# Patient Record
Sex: Male | Born: 1965 | Race: White | Hispanic: No | Marital: Single | Smoking: Never smoker
Health system: Southern US, Community
[De-identification: ages and names within clinical notes are randomized; demographics above are authoritative.]

## PROBLEM LIST (undated history)

## (undated) DIAGNOSIS — Z87442 Personal history of urinary calculi: Secondary | ICD-10-CM

## (undated) DIAGNOSIS — Z8669 Personal history of other diseases of the nervous system and sense organs: Secondary | ICD-10-CM

## (undated) DIAGNOSIS — T7840XA Allergy, unspecified, initial encounter: Secondary | ICD-10-CM

## (undated) DIAGNOSIS — G4730 Sleep apnea, unspecified: Secondary | ICD-10-CM

## (undated) DIAGNOSIS — D649 Anemia, unspecified: Secondary | ICD-10-CM

## (undated) HISTORY — PX: COLONOSCOPY: SHX174

## (undated) HISTORY — DX: Allergy, unspecified, initial encounter: T78.40XA

## (undated) HISTORY — DX: Anemia, unspecified: D64.9

## (undated) HISTORY — PX: EYE SURGERY: SHX253

## (undated) HISTORY — DX: Personal history of urinary calculi: Z87.442

## (undated) HISTORY — DX: Personal history of other diseases of the nervous system and sense organs: Z86.69

## (undated) HISTORY — DX: Sleep apnea, unspecified: G47.30

---

## 2001-09-08 ENCOUNTER — Ambulatory Visit (HOSPITAL_BASED_OUTPATIENT_CLINIC_OR_DEPARTMENT_OTHER): Admission: RE | Admit: 2001-09-08 | Discharge: 2001-09-08 | Payer: Self-pay | Admitting: Family Medicine

## 2003-02-15 ENCOUNTER — Encounter: Admission: RE | Admit: 2003-02-15 | Discharge: 2003-02-15 | Payer: Self-pay | Admitting: Family Medicine

## 2003-02-15 ENCOUNTER — Encounter: Payer: Self-pay | Admitting: Family Medicine

## 2003-05-11 ENCOUNTER — Emergency Department (HOSPITAL_COMMUNITY): Admission: AD | Admit: 2003-05-11 | Discharge: 2003-05-11 | Payer: Self-pay | Admitting: Family Medicine

## 2006-05-30 ENCOUNTER — Encounter: Admission: RE | Admit: 2006-05-30 | Discharge: 2006-05-30 | Payer: Self-pay | Admitting: Family Medicine

## 2009-12-20 ENCOUNTER — Ambulatory Visit (HOSPITAL_BASED_OUTPATIENT_CLINIC_OR_DEPARTMENT_OTHER): Admission: RE | Admit: 2009-12-20 | Discharge: 2009-12-20 | Payer: Self-pay | Admitting: Family Medicine

## 2009-12-30 ENCOUNTER — Ambulatory Visit: Payer: Self-pay | Admitting: Internal Medicine

## 2013-02-07 ENCOUNTER — Emergency Department (HOSPITAL_COMMUNITY): Payer: 59

## 2013-02-07 ENCOUNTER — Encounter (HOSPITAL_COMMUNITY): Payer: Self-pay | Admitting: Emergency Medicine

## 2013-02-07 ENCOUNTER — Emergency Department (HOSPITAL_COMMUNITY)
Admission: EM | Admit: 2013-02-07 | Discharge: 2013-02-07 | Disposition: A | Discharge status: Home / Self Care | Payer: 59 | Attending: Emergency Medicine | Admitting: Emergency Medicine

## 2013-02-07 DIAGNOSIS — N201 Calculus of ureter: Secondary | ICD-10-CM | POA: Insufficient documentation

## 2013-02-07 LAB — CBC WITH DIFFERENTIAL/PLATELET
Basophils Relative: 1 % (ref 0–1)
Eosinophils Relative: 2 % (ref 0–5)
HCT: 44.1 % (ref 39.0–52.0)
Hemoglobin: 14.7 g/dL (ref 13.0–17.0)
Lymphocytes Relative: 19 % (ref 12–46)
MCH: 28.5 pg (ref 26.0–34.0)
MCHC: 33.3 g/dL (ref 30.0–36.0)
Monocytes Absolute: 0.5 10*3/uL (ref 0.1–1.0)
Monocytes Relative: 8 % (ref 3–12)
Platelets: 198 10*3/uL (ref 150–400)
RDW: 14 % (ref 11.5–15.5)
WBC: 6.4 10*3/uL (ref 4.0–10.5)

## 2013-02-07 LAB — COMPREHENSIVE METABOLIC PANEL
ALT: 24 U/L (ref 0–53)
Albumin: 4.3 g/dL (ref 3.5–5.2)
Alkaline Phosphatase: 65 U/L (ref 39–117)
BUN: 18 mg/dL (ref 6–23)
Glucose, Bld: 116 mg/dL — ABNORMAL HIGH (ref 70–99)
Potassium: 3.9 mEq/L (ref 3.5–5.1)
Total Bilirubin: 0.4 mg/dL (ref 0.3–1.2)
Total Protein: 7.6 g/dL (ref 6.0–8.3)

## 2013-02-07 LAB — URINALYSIS, ROUTINE W REFLEX MICROSCOPIC
Bilirubin Urine: NEGATIVE
Protein, ur: NEGATIVE mg/dL

## 2013-02-07 LAB — CK TOTAL AND CKMB (NOT AT ARMC)
CK, MB: 1.5 ng/mL (ref 0.3–4.0)
Relative Index: INVALID (ref 0.0–2.5)
Total CK: 57 U/L (ref 7–232)

## 2013-02-07 LAB — URINE MICROSCOPIC-ADD ON

## 2013-02-07 MED ORDER — TAMSULOSIN HCL 0.4 MG PO CAPS: 0.4000 mg | ORAL_CAPSULE | Freq: Every day | ORAL | Status: DC

## 2013-02-07 MED ORDER — SODIUM CHLORIDE 0.9 % IV BOLUS (SEPSIS)
1000.0000 mL | Freq: Once | INTRAVENOUS | Status: AC
  Administered 2013-02-07: 1000 mL via INTRAVENOUS

## 2013-02-07 MED ORDER — OXYCODONE-ACETAMINOPHEN 5-325 MG PO TABS: 2.0000 | ORAL_TABLET | ORAL | Status: DC | PRN

## 2013-02-07 NOTE — ED Provider Notes (Signed)
CSN: [id number]     Arrival date & time 02/07/13  0730 History     First MD Initiated Contact with Patient 02/07/13 [phone]     Chief Complaint  Patient presents with  . Flank Pain   (Consider location/radiation/quality/duration/timing/severity/associated sxs/prior Treatment) HPI Comments: Pt states that he has been having left flank pain intermittently for 1 week:pt states that it happened after her was dead lifting:pt states that his urine has been dark in color:pt denies fever,n/v/d:pt states that he thinks he has had decreased urine output in the last week:denies history of kidney stone:nothing makes the pain better or worse:pain sometimes radiates to left abdomen  The history is provided by the patient. No language interpreter was used.    History reviewed. No pertinent past medical history. No past surgical history on file. No family history on file. History  Substance Use Topics  . Smoking status: Never Smoker   . Smokeless tobacco: Not on file  . Alcohol Use: No    Review of Systems  Constitutional: Negative.   Respiratory: Negative.   Cardiovascular: Negative.     Allergies  Review of patient's allergies indicates not on file.  Home Medications  No current outpatient prescriptions on file. BP 137/98  Pulse 78  Temp(Src) 97.9 F (36.6 C) (Oral)  Resp 16  SpO2 99% Physical Exam  Nursing note and vitals reviewed. Constitutional: He is oriented to person, place, and time. He appears well-developed and well-nourished.  HENT:  Head: Normocephalic and atraumatic.  Eyes: Conjunctivae and EOM are normal.  Neck: Normal range of motion. Neck supple.  Cardiovascular: Normal rate and regular rhythm.   Pulmonary/Chest: Effort normal and breath sounds normal.  Abdominal: Soft. Bowel sounds are normal.  Musculoskeletal: Normal range of motion.  Neurological: He is alert and oriented to person, place, and time.  Skin: Skin is warm and dry.  Psychiatric: He has a normal  mood and affect.    ED Course   Procedures (including critical care time)  Labs Reviewed  COMPREHENSIVE METABOLIC PANEL - Abnormal; Notable for the following:    Glucose, Bld 116 (*)    GFR calc non Af Amer 81 (*)    All other components within normal limits  URINALYSIS, ROUTINE W REFLEX MICROSCOPIC - Abnormal; Notable for the following:    APPearance CLOUDY (*)    Specific Gravity, Urine 1.031 (*)    Hgb urine dipstick LARGE (*)    All other components within normal limits  URINE MICROSCOPIC-ADD ON - Abnormal; Notable for the following:    Casts HYALINE CASTS (*)    Crystals CA OXALATE CRYSTALS (*)    All other components within normal limits  CBC WITH DIFFERENTIAL  CK TOTAL AND CKMB   Ct Abdomen Pelvis Wo Contrast  02/07/2013   *RADIOLOGY REPORT*  Clinical Data: Left side abdominal pain for 1 week, tea colored urine, left flank pain, recently lifting weights  CT ABDOMEN AND PELVIS WITHOUT CONTRAST  Technique:  Multidetector CT imaging of the abdomen and pelvis was performed following the standard protocol without intravenous contrast. Sagittal and coronal MPR images reconstructed from axial data set.  Comparison: None  Findings: Lung bases clear. Left hydronephrosis secondary to a proximal left ureteral calculus 4 mm diameter image 52, 9 mm in length. Question cyst at upper pole left kidney, 2.6 x 2.3 cm image 29. No additional urinary tract calcification. Distal ureters and bladder normal appearance. Within limits of a nonenhanced exam no focal abnormalities of the liver, spleen, pancreas,  kidneys, or adrenal glands otherwise identified.  Circumaortic left renal vein. Normal appendix. Rectosigmoid colon underdistended with suboptimal assessment of wall thickness. Stomach and bowel loops otherwise normal appearance. No mass, adenopathy, free fluid inflammatory process. No acute osseous findings.  IMPRESSION: Left hydronephrosis secondary to a 4 mm diameter x 9 mm length proximal left  ureteral calculus. Suspect cyst at upper pole of the left kidney 2.6 cm diameter; this can be confirmed by non emergent follow-up sonographic evaluation.   Original Report Authenticated By: Ulyses Southward, M.D.   1. Ureteral stone     MDM  Pt is feeling fine at this time and is not needing any medication:discussed need for follow  Up:no infection noted in the urine  Teressa Lower, NP 02/07/13 [phone]

## 2013-02-07 NOTE — ED Notes (Addendum)
Pt c/o lt flank pain, dysuria x 1 wk.

## 2013-02-07 NOTE — ED Provider Notes (Signed)
Medical screening examination/treatment/procedure(s) were performed by non-physician practitioner and as supervising physician I was immediately available for consultation/collaboration.   Kenneisha Cochrane, MD 02/07/13 1340 

## 2013-02-07 NOTE — ED Notes (Signed)
Pt report left sided abdominal pain 8/10 x1 week. Upon further assessment pt reports he was lifting weights on 11/11/22 doing dead lifts and then pain started the next day. Reports his urine was tea colored.

## 2014-07-27 ENCOUNTER — Other Ambulatory Visit (HOSPITAL_COMMUNITY): Payer: Self-pay | Admitting: Urology

## 2014-07-27 DIAGNOSIS — N281 Cyst of kidney, acquired: Secondary | ICD-10-CM

## 2014-08-01 ENCOUNTER — Ambulatory Visit (HOSPITAL_COMMUNITY): Payer: 59

## 2014-08-22 ENCOUNTER — Ambulatory Visit (HOSPITAL_COMMUNITY): Admission: RE | Admit: 2014-08-22 | Payer: 59 | Source: Ambulatory Visit

## 2014-08-24 ENCOUNTER — Ambulatory Visit (HOSPITAL_COMMUNITY)
Admission: RE | Admit: 2014-08-24 | Discharge: 2014-08-24 | Disposition: A | Discharge status: Home / Self Care | Payer: 59 | Source: Ambulatory Visit | Attending: Urology | Admitting: Urology

## 2014-08-24 DIAGNOSIS — Q6100 Congenital renal cyst, unspecified: Secondary | ICD-10-CM | POA: Insufficient documentation

## 2014-08-24 DIAGNOSIS — N281 Cyst of kidney, acquired: Secondary | ICD-10-CM

## 2015-08-30 DIAGNOSIS — N281 Cyst of kidney, acquired: Secondary | ICD-10-CM | POA: Diagnosis not present

## 2015-09-05 DIAGNOSIS — N281 Cyst of kidney, acquired: Secondary | ICD-10-CM | POA: Diagnosis not present

## 2015-09-05 DIAGNOSIS — Z0000 Encounter for general adult medical examination without abnormal findings: Secondary | ICD-10-CM | POA: Diagnosis not present

## 2015-09-05 DIAGNOSIS — N200 Calculus of kidney: Secondary | ICD-10-CM | POA: Diagnosis not present

## 2015-11-07 DIAGNOSIS — G4733 Obstructive sleep apnea (adult) (pediatric): Secondary | ICD-10-CM | POA: Diagnosis not present

## 2016-01-27 IMAGING — US US RENAL
1 series · 14 of 25 positions shown · non-contrast
Comparison: CT 06/29/2013 and 02/07/2013 as well as ultrasound
05/18/2013

CLINICAL DATA: Left renal cyst evaluation.

EXAM:
RENAL/URINARY TRACT ULTRASOUND COMPLETE

[Series 1: us renal · 0.25mm/px · 14 of 63 slices shown]
[im 1/63]
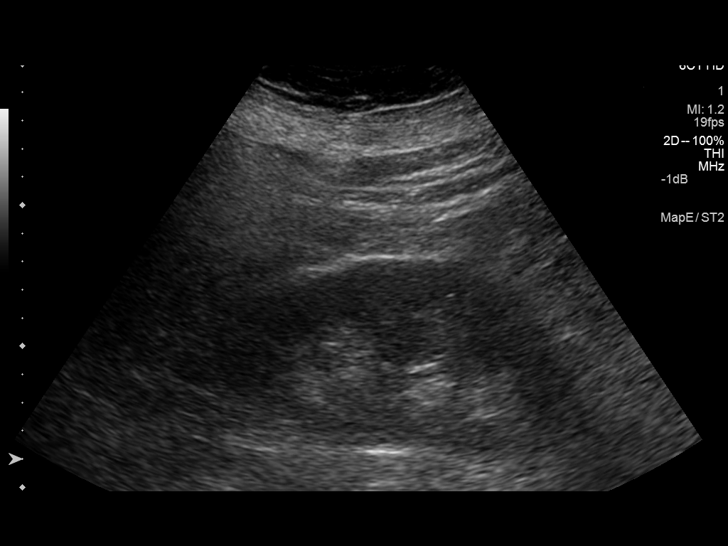
[im 6/63]
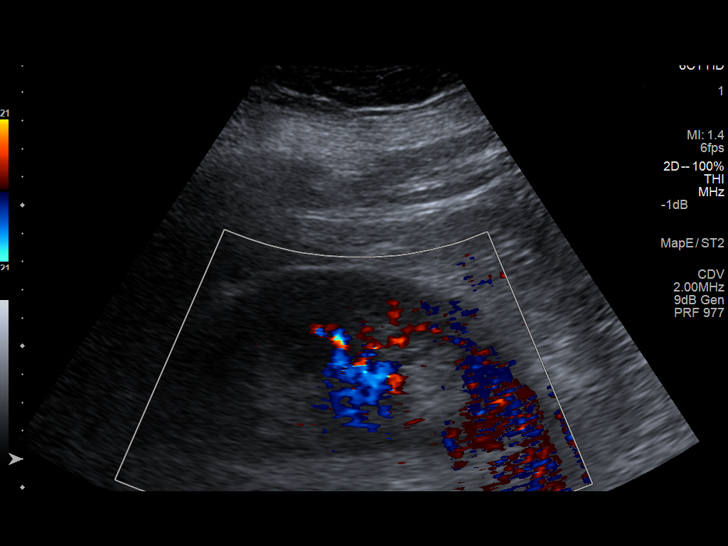
[im 11/63]
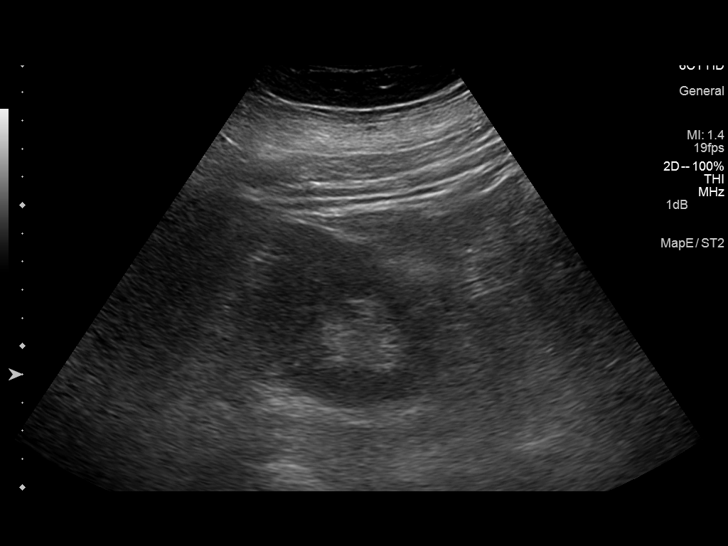
[im 16/63]
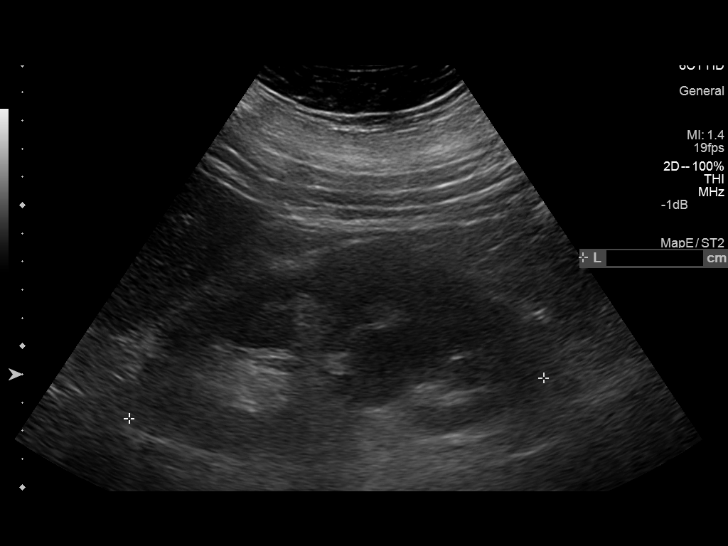
[im 21/63]
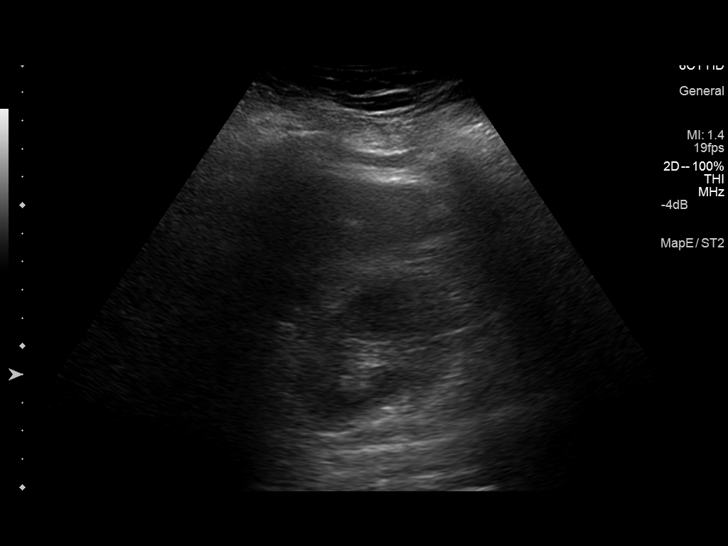
[im 24/63]
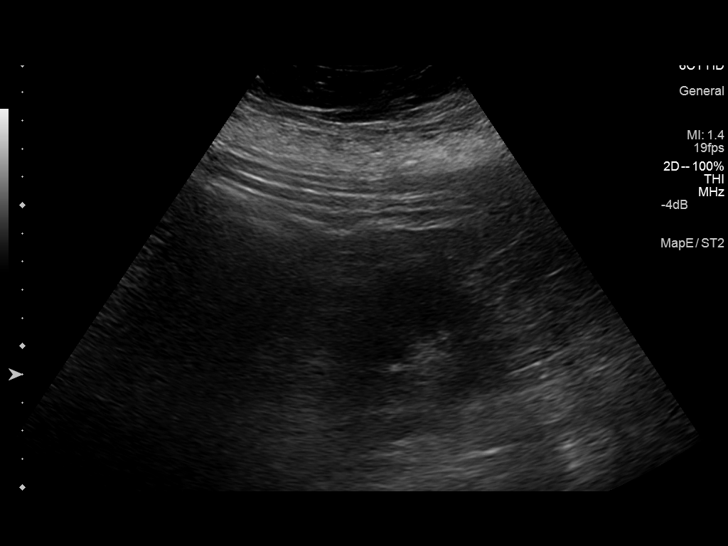
[im 29/63]
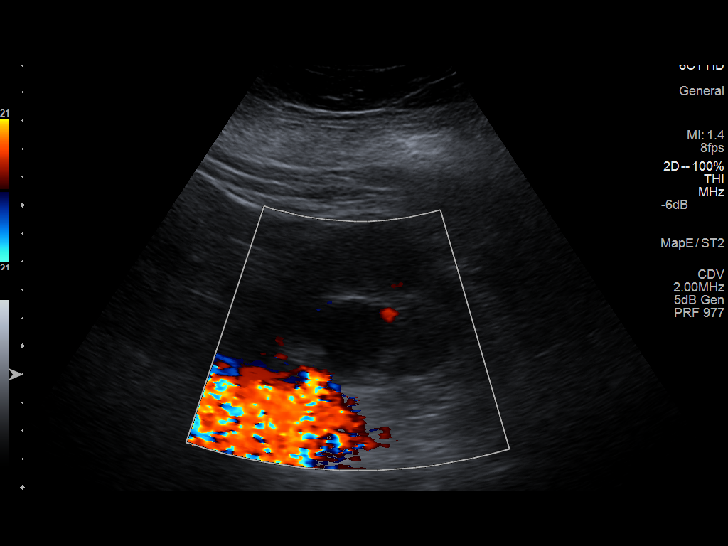
[im 34/63]
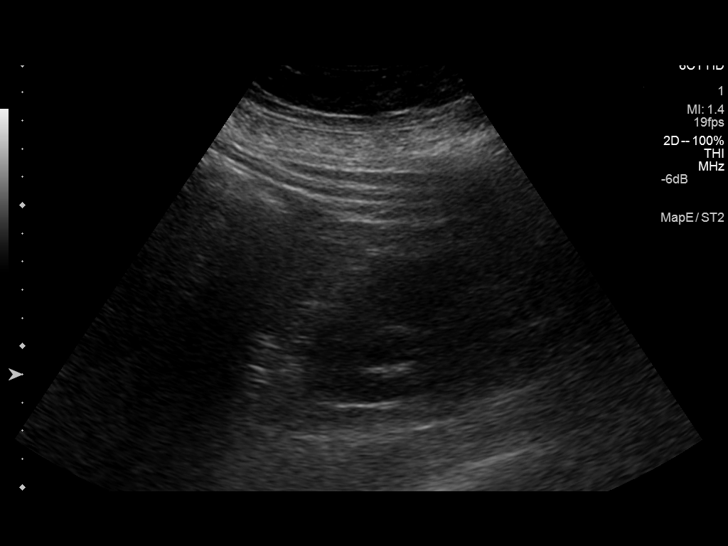
[im 39/63]
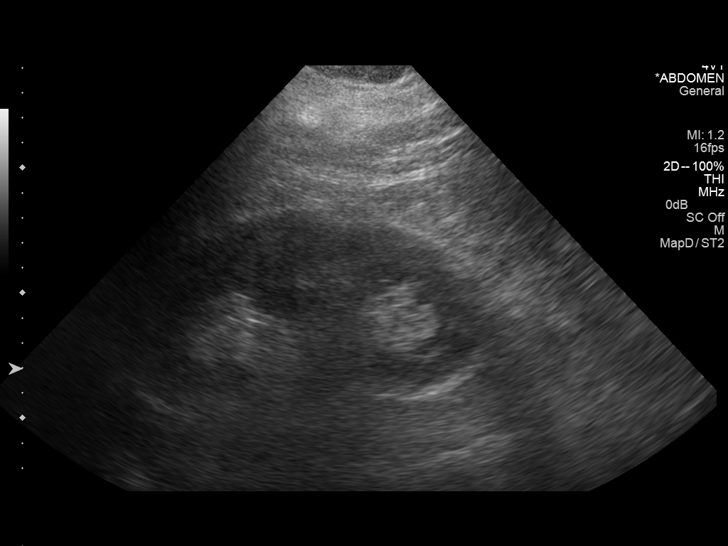
[im 42/63]
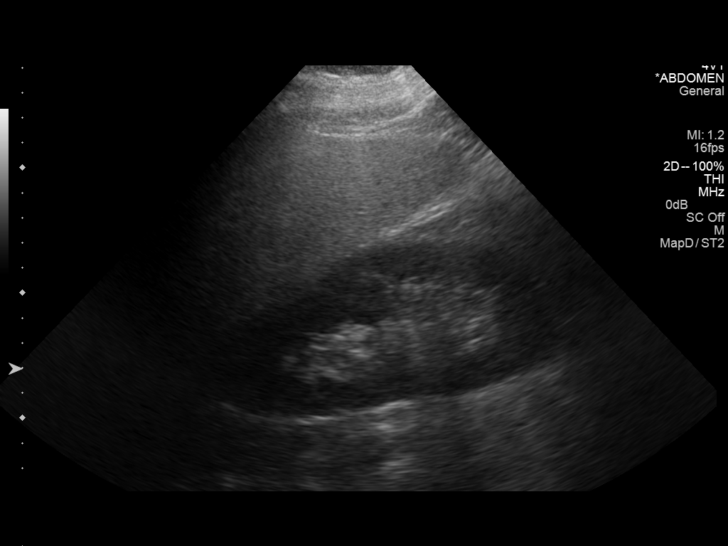
[im 47/63]
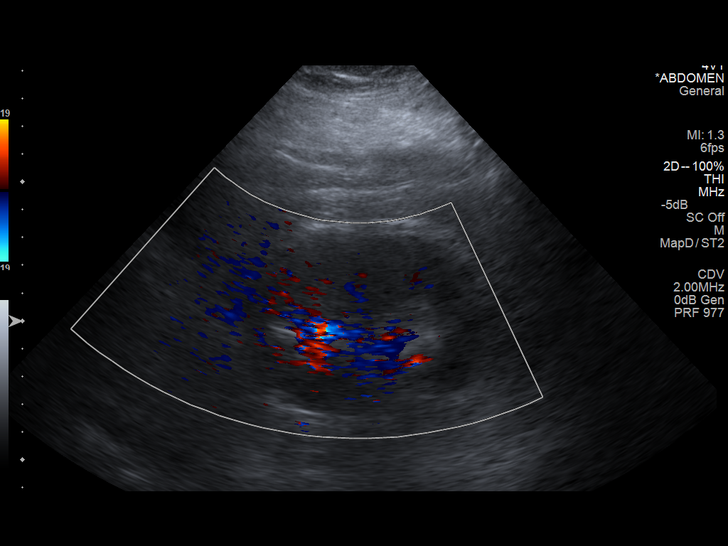
[im 52/63]
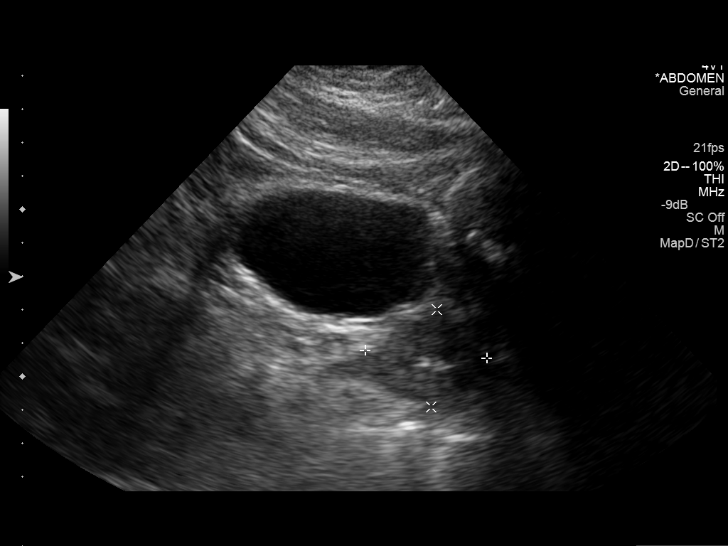
[im 57/63]
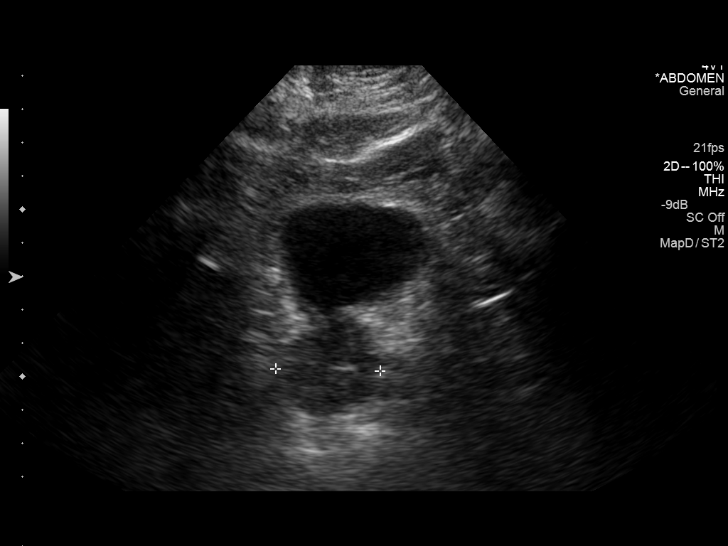
[im 63/63]
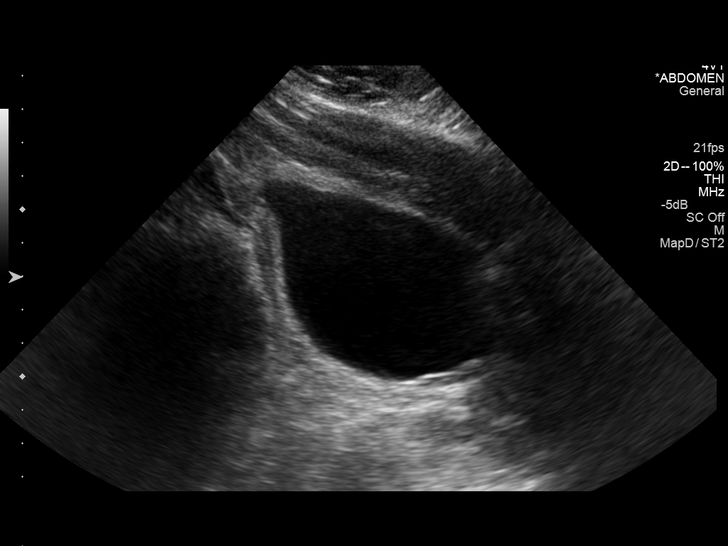

[14 of 25 positions shown; findings below may reference images not displayed]

FINDINGS: Right Kidney:

Length: 14.4 cm. Echogenicity within normal limits. No mass or
hydronephrosis visualized.

Left Kidney:

Length: 14.8 cm. Echogenicity within normal limits. No solid mass or
hydronephrosis visualized. 1.8 cm cyst over the lower pole
unchanged. 3.1 cm cyst over the mid to lower pole without
significant change. The upper pole cyst is stable by CT, although
not well seen sonographically.

Bladder:

Appears normal for degree of bladder distention.
IMPRESSION: Stable left renal cysts.

## 2016-08-01 DIAGNOSIS — G4733 Obstructive sleep apnea (adult) (pediatric): Secondary | ICD-10-CM | POA: Diagnosis not present

## 2016-09-10 DIAGNOSIS — H532 Diplopia: Secondary | ICD-10-CM | POA: Diagnosis not present

## 2016-09-10 DIAGNOSIS — H2513 Age-related nuclear cataract, bilateral: Secondary | ICD-10-CM | POA: Diagnosis not present

## 2016-09-10 DIAGNOSIS — Z880 Allergy status to penicillin: Secondary | ICD-10-CM | POA: Diagnosis not present

## 2016-09-10 DIAGNOSIS — H4912 Fourth [trochlear] nerve palsy, left eye: Secondary | ICD-10-CM | POA: Diagnosis not present

## 2016-09-10 DIAGNOSIS — Z9889 Other specified postprocedural states: Secondary | ICD-10-CM | POA: Diagnosis not present

## 2016-11-22 DIAGNOSIS — M546 Pain in thoracic spine: Secondary | ICD-10-CM | POA: Diagnosis not present

## 2017-08-29 DIAGNOSIS — G4736 Sleep related hypoventilation in conditions classified elsewhere: Secondary | ICD-10-CM | POA: Diagnosis not present

## 2017-08-29 DIAGNOSIS — G4733 Obstructive sleep apnea (adult) (pediatric): Secondary | ICD-10-CM | POA: Diagnosis not present

## 2017-12-10 DIAGNOSIS — G4730 Sleep apnea, unspecified: Secondary | ICD-10-CM | POA: Diagnosis not present

## 2017-12-10 DIAGNOSIS — M25531 Pain in right wrist: Secondary | ICD-10-CM | POA: Diagnosis not present

## 2017-12-10 DIAGNOSIS — M546 Pain in thoracic spine: Secondary | ICD-10-CM | POA: Diagnosis not present

## 2017-12-10 DIAGNOSIS — Z125 Encounter for screening for malignant neoplasm of prostate: Secondary | ICD-10-CM | POA: Diagnosis not present

## 2017-12-10 DIAGNOSIS — I10 Essential (primary) hypertension: Secondary | ICD-10-CM | POA: Diagnosis not present

## 2017-12-10 DIAGNOSIS — Z1211 Encounter for screening for malignant neoplasm of colon: Secondary | ICD-10-CM | POA: Diagnosis not present

## 2017-12-10 DIAGNOSIS — Z0000 Encounter for general adult medical examination without abnormal findings: Secondary | ICD-10-CM | POA: Diagnosis not present

## 2017-12-10 DIAGNOSIS — Z13220 Encounter for screening for lipoid disorders: Secondary | ICD-10-CM | POA: Diagnosis not present

## 2017-12-12 ENCOUNTER — Encounter: Payer: Self-pay | Admitting: Gastroenterology

## 2017-12-19 ENCOUNTER — Encounter: Payer: Self-pay | Admitting: Gastroenterology

## 2017-12-24 DIAGNOSIS — G4733 Obstructive sleep apnea (adult) (pediatric): Secondary | ICD-10-CM | POA: Diagnosis not present

## 2017-12-24 DIAGNOSIS — H532 Diplopia: Secondary | ICD-10-CM | POA: Diagnosis not present

## 2017-12-24 DIAGNOSIS — Z9989 Dependence on other enabling machines and devices: Secondary | ICD-10-CM | POA: Insufficient documentation

## 2017-12-24 DIAGNOSIS — J343 Hypertrophy of nasal turbinates: Secondary | ICD-10-CM | POA: Diagnosis not present

## 2017-12-26 DIAGNOSIS — M545 Low back pain: Secondary | ICD-10-CM | POA: Diagnosis not present

## 2017-12-26 DIAGNOSIS — N281 Cyst of kidney, acquired: Secondary | ICD-10-CM | POA: Diagnosis not present

## 2017-12-26 DIAGNOSIS — N200 Calculus of kidney: Secondary | ICD-10-CM | POA: Diagnosis not present

## 2018-01-07 ENCOUNTER — Encounter: Payer: 59 | Admitting: Gastroenterology

## 2018-01-27 DIAGNOSIS — N281 Cyst of kidney, acquired: Secondary | ICD-10-CM | POA: Diagnosis not present

## 2018-01-27 DIAGNOSIS — M545 Low back pain: Secondary | ICD-10-CM | POA: Diagnosis not present

## 2018-02-03 ENCOUNTER — Encounter: Payer: Self-pay | Admitting: Gastroenterology

## 2018-02-03 ENCOUNTER — Ambulatory Visit (AMBULATORY_SURGERY_CENTER): Payer: Self-pay | Admitting: *Deleted

## 2018-02-03 VITALS — Ht 75.0 in | Wt 346.2 lb

## 2018-02-03 DIAGNOSIS — Z1211 Encounter for screening for malignant neoplasm of colon: Secondary | ICD-10-CM

## 2018-02-03 MED ORDER — NA SULFATE-K SULFATE-MG SULF 17.5-3.13-1.6 GM/177ML PO SOLN: 1.0000 | Freq: Once | ORAL | 0 refills | Status: AC

## 2018-02-03 NOTE — Progress Notes (Signed)
Denies allergies to eggs or soy products. Denies complications with sedation or anesthesia. Denies O2 use. Denies use of diet or weight loss medications.  Emmi instructions given for colonoscopy.  

## 2018-02-17 ENCOUNTER — Encounter: Payer: 59 | Admitting: Gastroenterology

## 2018-02-17 ENCOUNTER — Telehealth: Payer: Self-pay | Admitting: Gastroenterology

## 2018-02-19 NOTE — Telephone Encounter (Signed)
Thanks for letting me know. Pl reschedule when he is ready

## 2018-02-25 ENCOUNTER — Other Ambulatory Visit: Payer: Self-pay | Admitting: Family Medicine

## 2018-02-25 ENCOUNTER — Ambulatory Visit
Admission: RE | Admit: 2018-02-25 | Discharge: 2018-02-25 | Disposition: A | Discharge status: Home / Self Care | Payer: 59 | Source: Ambulatory Visit | Attending: Family Medicine | Admitting: Family Medicine

## 2018-02-25 DIAGNOSIS — R0781 Pleurodynia: Secondary | ICD-10-CM

## 2018-02-25 DIAGNOSIS — R000 Tachycardia, unspecified: Secondary | ICD-10-CM | POA: Diagnosis not present

## 2018-02-25 DIAGNOSIS — R0789 Other chest pain: Secondary | ICD-10-CM | POA: Diagnosis not present

## 2018-02-25 DIAGNOSIS — R232 Flushing: Secondary | ICD-10-CM | POA: Diagnosis not present

## 2018-02-25 DIAGNOSIS — M549 Dorsalgia, unspecified: Secondary | ICD-10-CM | POA: Diagnosis not present

## 2018-02-25 DIAGNOSIS — I10 Essential (primary) hypertension: Secondary | ICD-10-CM | POA: Diagnosis not present

## 2018-02-25 DIAGNOSIS — W57XXXA Bitten or stung by nonvenomous insect and other nonvenomous arthropods, initial encounter: Secondary | ICD-10-CM | POA: Diagnosis not present

## 2018-04-16 DIAGNOSIS — G4733 Obstructive sleep apnea (adult) (pediatric): Secondary | ICD-10-CM | POA: Diagnosis not present

## 2018-06-05 ENCOUNTER — Other Ambulatory Visit: Payer: Self-pay | Admitting: Family Medicine

## 2018-06-05 DIAGNOSIS — S81801S Unspecified open wound, right lower leg, sequela: Secondary | ICD-10-CM

## 2018-06-05 DIAGNOSIS — R609 Edema, unspecified: Secondary | ICD-10-CM | POA: Diagnosis not present

## 2018-06-11 ENCOUNTER — Other Ambulatory Visit: Payer: 59

## 2018-06-16 ENCOUNTER — Other Ambulatory Visit: Payer: 59

## 2018-06-22 ENCOUNTER — Other Ambulatory Visit: Payer: 59

## 2018-06-25 ENCOUNTER — Ambulatory Visit
Admission: RE | Admit: 2018-06-25 | Discharge: 2018-06-25 | Disposition: A | Discharge status: Home / Self Care | Payer: 59 | Source: Ambulatory Visit | Attending: Family Medicine | Admitting: Family Medicine

## 2018-06-25 DIAGNOSIS — S81801A Unspecified open wound, right lower leg, initial encounter: Secondary | ICD-10-CM | POA: Diagnosis not present

## 2018-06-25 DIAGNOSIS — S81801S Unspecified open wound, right lower leg, sequela: Secondary | ICD-10-CM

## 2018-08-12 DIAGNOSIS — H5213 Myopia, bilateral: Secondary | ICD-10-CM | POA: Diagnosis not present

## 2018-08-12 DIAGNOSIS — H524 Presbyopia: Secondary | ICD-10-CM | POA: Diagnosis not present

## 2018-08-14 DIAGNOSIS — H4913 Fourth [trochlear] nerve palsy, bilateral: Secondary | ICD-10-CM | POA: Diagnosis not present

## 2018-08-21 ENCOUNTER — Other Ambulatory Visit: Payer: Self-pay | Admitting: Ophthalmology

## 2018-08-21 DIAGNOSIS — H4913 Fourth [trochlear] nerve palsy, bilateral: Secondary | ICD-10-CM

## 2018-09-07 ENCOUNTER — Ambulatory Visit
Admission: RE | Admit: 2018-09-07 | Discharge: 2018-09-07 | Disposition: A | Discharge status: Home / Self Care | Payer: 59 | Source: Ambulatory Visit | Attending: Ophthalmology | Admitting: Ophthalmology

## 2018-09-07 ENCOUNTER — Other Ambulatory Visit: Payer: Self-pay

## 2018-09-07 DIAGNOSIS — H4913 Fourth [trochlear] nerve palsy, bilateral: Secondary | ICD-10-CM | POA: Diagnosis not present

## 2018-09-07 MED ORDER — GADOBENATE DIMEGLUMINE 529 MG/ML IV SOLN
20.0000 mL | Freq: Once | INTRAVENOUS | Status: AC | PRN
  Administered 2018-09-07: 20 mL via INTRAVENOUS

## 2018-09-16 DIAGNOSIS — R030 Elevated blood-pressure reading, without diagnosis of hypertension: Secondary | ICD-10-CM | POA: Diagnosis not present

## 2018-09-16 DIAGNOSIS — Q759 Congenital malformation of skull and face bones, unspecified: Secondary | ICD-10-CM | POA: Diagnosis not present

## 2018-09-16 DIAGNOSIS — Z6841 Body Mass Index (BMI) 40.0 and over, adult: Secondary | ICD-10-CM | POA: Diagnosis not present

## 2018-11-30 DIAGNOSIS — G4730 Sleep apnea, unspecified: Secondary | ICD-10-CM | POA: Diagnosis not present

## 2018-11-30 DIAGNOSIS — I10 Essential (primary) hypertension: Secondary | ICD-10-CM | POA: Diagnosis not present

## 2018-11-30 DIAGNOSIS — Z0000 Encounter for general adult medical examination without abnormal findings: Secondary | ICD-10-CM | POA: Diagnosis not present

## 2018-11-30 DIAGNOSIS — Z6841 Body Mass Index (BMI) 40.0 and over, adult: Secondary | ICD-10-CM | POA: Diagnosis not present

## 2018-11-30 DIAGNOSIS — Z1211 Encounter for screening for malignant neoplasm of colon: Secondary | ICD-10-CM | POA: Diagnosis not present

## 2018-12-09 DIAGNOSIS — M545 Low back pain: Secondary | ICD-10-CM | POA: Diagnosis not present

## 2018-12-09 DIAGNOSIS — N200 Calculus of kidney: Secondary | ICD-10-CM | POA: Diagnosis not present

## 2018-12-21 DIAGNOSIS — M793 Panniculitis, unspecified: Secondary | ICD-10-CM | POA: Diagnosis not present

## 2018-12-21 DIAGNOSIS — L821 Other seborrheic keratosis: Secondary | ICD-10-CM | POA: Diagnosis not present

## 2019-01-18 ENCOUNTER — Other Ambulatory Visit: Payer: Self-pay

## 2019-01-18 ENCOUNTER — Ambulatory Visit: Payer: 59 | Admitting: *Deleted

## 2019-01-18 VITALS — Ht 75.0 in | Wt 344.0 lb

## 2019-01-18 DIAGNOSIS — Z1211 Encounter for screening for malignant neoplasm of colon: Secondary | ICD-10-CM

## 2019-01-18 MED ORDER — NA SULFATE-K SULFATE-MG SULF 17.5-3.13-1.6 GM/177ML PO SOLN: 1.0000 | Freq: Once | ORAL | 0 refills | Status: AC

## 2019-01-18 NOTE — Progress Notes (Signed)

## 2019-01-26 ENCOUNTER — Other Ambulatory Visit: Payer: Self-pay

## 2019-01-26 DIAGNOSIS — I83899 Varicose veins of unspecified lower extremities with other complications: Secondary | ICD-10-CM

## 2019-01-27 ENCOUNTER — Encounter (HOSPITAL_COMMUNITY): Payer: 59

## 2019-01-27 ENCOUNTER — Encounter: Payer: 59 | Admitting: Vascular Surgery

## 2019-01-29 ENCOUNTER — Telehealth: Payer: Self-pay | Admitting: Gastroenterology

## 2019-01-29 NOTE — Telephone Encounter (Signed)
Left message to call back to ask Covid-19 screening questions.  Covid-19 Screening Questions: Do you now or have you had a fever in the last 14 days?  Do you have any respiratory symptoms of shortness of breath or cough now or in the last 14 days?  Do you have any family members or close contacts with diagnosed or suspected Covid-19 in the past 14 days?  Have you been tested for Covid-19 and found to be positive?   Pls make pt aware of that care partner may wait in the car or come up to the lobby during the procedure but will need to provide their own mask.

## 2019-01-29 NOTE — Telephone Encounter (Signed)
Pt responded "no" to all screening questions °

## 2019-02-01 ENCOUNTER — Other Ambulatory Visit: Payer: Self-pay

## 2019-02-01 ENCOUNTER — Ambulatory Visit (AMBULATORY_SURGERY_CENTER): Payer: 59 | Admitting: Gastroenterology

## 2019-02-01 ENCOUNTER — Encounter: Payer: Self-pay | Admitting: Gastroenterology

## 2019-02-01 VITALS — BP 128/86 | HR 84 | Temp 97.5°F | Resp 17 | Ht 75.0 in | Wt 344.0 lb

## 2019-02-01 DIAGNOSIS — D124 Benign neoplasm of descending colon: Secondary | ICD-10-CM

## 2019-02-01 DIAGNOSIS — D120 Benign neoplasm of cecum: Secondary | ICD-10-CM

## 2019-02-01 DIAGNOSIS — Z1211 Encounter for screening for malignant neoplasm of colon: Secondary | ICD-10-CM

## 2019-02-01 DIAGNOSIS — D123 Benign neoplasm of transverse colon: Secondary | ICD-10-CM

## 2019-02-01 MED ORDER — SODIUM CHLORIDE 0.9 % IV SOLN: 500.0000 mL | Freq: Once | INTRAVENOUS | Status: DC

## 2019-02-01 NOTE — Progress Notes (Signed)
Called to room to assist during endoscopic procedure.  Patient ID and intended procedure confirmed with present staff. Received instructions for my participation in the procedure from the performing physician.  

## 2019-02-01 NOTE — Progress Notes (Signed)
Pt's states no medical or surgical changes since previsit or office visit.  Temp per June VS per Izora Gala

## 2019-02-01 NOTE — Op Note (Signed)
[place] Patient Name: Travis Fleming Procedure Date: 02/01/2019 9:24 AM MRN: [id number] Endoscopist: Jackquline Denmark , MD Age: 53 Referring MD:  Date of Birth: [birth year] Gender: Male Account #: [id number] Procedure:                Colonoscopy Indications:              Screening for colorectal malignant neoplasm Medicines:                Monitored Anesthesia Care Procedure:                Pre-Anesthesia Assessment:                           - Prior to the procedure, a History and Physical                            was performed, and patient medications and                            allergies were reviewed. The patient's tolerance of                            previous anesthesia was also reviewed. The risks                            and benefits of the procedure and the sedation                            options and risks were discussed with the patient.                            All questions were answered, and informed consent                            was obtained. Prior Anticoagulants: The patient has                            taken no previous anticoagulant or antiplatelet                            agents. ASA Grade Assessment: II - A patient with                            mild systemic disease. After reviewing the risks                            and benefits, the patient was deemed in                            satisfactory condition to undergo the procedure.                           After obtaining informed consent, the colonoscope  was passed under direct vision. Throughout the                            procedure, the patient's blood pressure, pulse, and                            oxygen saturations were monitored continuously. The                            Colonoscope was introduced through the anus and                            advanced to the the cecum, identified by                            appendiceal orifice and  ileocecal valve. The                            colonoscopy was performed without difficulty. The                            patient tolerated the procedure well. The quality                            of the bowel preparation was good. The ileocecal                            valve, appendiceal orifice, and rectum were                            photographed. Scope In: 9:32:32 AM Scope Out: 9:46:30 AM Scope Withdrawal Time: 0 hours 9 minutes 56 seconds  Total Procedure Duration: 0 hours 13 minutes 58 seconds  Findings:                 Two sessile polyps were found in the mid descending                            colon and distal transverse colon. The polyps were                            6 to 8 mm in size. These polyps were removed with a                            cold snare. Resection and retrieval were complete.                            Estimated blood loss: none.                           Non-bleeding external and internal hemorrhoids were                            found during retroflexion and during perianal exam.  The hemorrhoids were moderate.                           The exam was otherwise without abnormality. Complications:            No immediate complications. Estimated Blood Loss:     Estimated blood loss: none. Impression:               - Two 6 to 8 mm polyps in the mid descending colon                            and in the distal transverse colon, removed with a                            cold snare. Resected and retrieved.                           - Non-bleeding external and internal hemorrhoids.                           - The examination was otherwise normal. Recommendation:           - Patient has a contact number available for                            emergencies. The signs and symptoms of potential                            delayed complications were discussed with the                            patient. Return to normal activities  tomorrow.                            Written discharge instructions were provided to the                            patient.                           - Resume previous diet.                           - Continue present medications.                           - Await pathology results.                           - Repeat colonoscopy for surveillance based on                            pathology results.                           - Return to GI clinic PRN. Jackquline Denmark, MD 02/01/2019 9:51:16 AM This report has been signed electronically.

## 2019-02-01 NOTE — Patient Instructions (Signed)
Impression/Recommendations:  Polyp handout given to patient. Hemorrhoid handout given to patient.  Resume previous diet. Continue present medications. Await pathology results.  Repeat colonoscopy for surveillance.  Date to be determined after pathology results reviewed.  Return to GI clinic as needed.  YOU HAD AN ENDOSCOPIC PROCEDURE TODAY AT [facility]:   Refer to the procedure report that was given to you for any specific questions about what was found during the examination.  If the procedure report does not answer your questions, please call your gastroenterologist to clarify.  If you requested that your care partner not be given the details of your procedure findings, then the procedure report has been included in a sealed envelope for you to review at your convenience later.  YOU SHOULD EXPECT: Some feelings of bloating in the abdomen. Passage of more gas than usual.  Walking can help get rid of the air that was put into your GI tract during the procedure and reduce the bloating. If you had a lower endoscopy (such as a colonoscopy or flexible sigmoidoscopy) you may notice spotting of blood in your stool or on the toilet paper. If you underwent a bowel prep for your procedure, you may not have a normal bowel movement for a few days.  Please Note:  You might notice some irritation and congestion in your nose or some drainage.  This is from the oxygen used during your procedure.  There is no need for concern and it should clear up in a day or so.  SYMPTOMS TO REPORT IMMEDIATELY:   Following lower endoscopy (colonoscopy or flexible sigmoidoscopy):  Excessive amounts of blood in the stool  Significant tenderness or worsening of abdominal pains  Swelling of the abdomen that is new, acute  Fever of 100F or higher For urgent or emergent issues, a gastroenterologist can be reached at any hour by calling [phone].   DIET:  We do recommend a small meal at first, but  then you may proceed to your regular diet.  Drink plenty of fluids but you should avoid alcoholic beverages for 24 hours.  ACTIVITY:  You should plan to take it easy for the rest of today and you should NOT DRIVE or use heavy machinery until tomorrow (because of the sedation medicines used during the test).    FOLLOW UP: Our staff will call the number listed on your records 48-72 hours following your procedure to check on you and address any questions or concerns that you may have regarding the information given to you following your procedure. If we do not reach you, we will leave a message.  We will attempt to reach you two times.  During this call, we will ask if you have developed any symptoms of COVID 19. If you develop any symptoms (ie: fever, flu-like symptoms, shortness of breath, cough etc.) before then, please call [phone].  If you test positive for Covid 19 in the 2 weeks post procedure, please call and report this information to [place].    If any biopsies were taken you will be contacted by phone or by letter within the next 1-3 weeks.  Please call us at [phone] if you have not heard about the biopsies in 3 weeks.    SIGNATURES/CONFIDENTIALITY: You and/or your care partner have signed paperwork which will be entered into your electronic medical record.  These signatures attest to the fact that that the information above on your After Visit Summary has been reviewed and is understood.  Full responsibility of the confidentiality of this discharge information lies with you and/or your care-partner.

## 2019-02-01 NOTE — Progress Notes (Signed)
To PACU, VSS. Report to [email or web address] 

## 2019-02-03 ENCOUNTER — Telehealth (HOSPITAL_COMMUNITY): Payer: Self-pay | Admitting: Rehabilitation

## 2019-02-03 ENCOUNTER — Telehealth: Payer: Self-pay | Admitting: *Deleted

## 2019-02-03 NOTE — Telephone Encounter (Signed)

## 2019-02-03 NOTE — Telephone Encounter (Signed)
First follow up call attempt.  No answer or voicemial

## 2019-02-03 NOTE — Telephone Encounter (Signed)
  Follow up Call-  Call back number 02/01/2019  Post procedure Call Back phone  # [phone]  Permission to leave phone message Yes  Some recent data might be hidden    No answer, no voicemail; not able to leave a message

## 2019-02-04 ENCOUNTER — Ambulatory Visit (HOSPITAL_COMMUNITY)
Admission: RE | Admit: 2019-02-04 | Discharge: 2019-02-04 | Disposition: A | Discharge status: Home / Self Care | Payer: 59 | Source: Ambulatory Visit | Attending: Family | Admitting: Family

## 2019-02-04 ENCOUNTER — Other Ambulatory Visit: Payer: Self-pay

## 2019-02-04 ENCOUNTER — Ambulatory Visit (INDEPENDENT_AMBULATORY_CARE_PROVIDER_SITE_OTHER): Payer: 59 | Admitting: Vascular Surgery

## 2019-02-04 ENCOUNTER — Encounter: Payer: Self-pay | Admitting: Vascular Surgery

## 2019-02-04 VITALS — BP 137/90 | HR 86 | Temp 98.0°F | Resp 12 | Ht 72.0 in | Wt 336.0 lb

## 2019-02-04 DIAGNOSIS — I83899 Varicose veins of unspecified lower extremities with other complications: Secondary | ICD-10-CM | POA: Insufficient documentation

## 2019-02-04 DIAGNOSIS — I83813 Varicose veins of bilateral lower extremities with pain: Secondary | ICD-10-CM | POA: Diagnosis not present

## 2019-02-04 NOTE — Progress Notes (Signed)
Referring Physician: Jamse Belfast MD  Patient name: Travis Travis MRN: [id number] DOB: 07-05-1965 Sex: male  REASON FOR CONSULT: Symptomatic varicose veins with ulcer  HPI: Travis Travis is a 53 y.o. male, referred for evaluation of a previous ulcer on his right lower extremity.  Patient states he had an insect bite on his right leg several months ago.  This then developed into an ulcer.  It took several weeks to heal this.  He has been intermittently wearing some lower extremity compression stockings for the past few weeks.  He has no prior history of DVT.  He does not know his family history.  He has never really had any other prior nonhealing wounds.  He does not use tobacco products.  His current compression stockings are 15 to 20 mm knee-high stockings.  Does not really have any complaints with his left leg.  Past Medical History:  Diagnosis Date  . Allergy    seasonal  . Anemia    as a child  . History of double vision   . History of kidney stones   . Sleep apnea    on c-pap   History reviewed. No pertinent surgical history.  Family History  Problem Relation Age of Onset  . Colon cancer Neg Hx   . Esophageal cancer Neg Hx   . Rectal cancer Neg Hx   . Stomach cancer Neg Hx   . Colon polyps Neg Hx     SOCIAL HISTORY: Social History   Socioeconomic History  . Marital status: Single    Spouse name: Not on file  . Number of children: Not on file  . Years of education: Not on file  . Highest education level: Not on file  Occupational History  . Not on file  Social Needs  . Financial resource strain: Not on file  . Food insecurity    Worry: Not on file    Inability: Not on file  . Transportation needs    Medical: Not on file    Non-medical: Not on file  Tobacco Use  . Smoking status: Never Smoker  . Smokeless tobacco: Never Used  Substance and Sexual Activity  . Alcohol use: Yes    Comment: rarely  . Drug use: No  . Sexual activity: Not on file   Lifestyle  . Physical activity    Days per week: Not on file    Minutes per session: Not on file  . Stress: Not on file  Relationships  . Social Herbalist on phone: Not on file    Gets together: Not on file    Attends religious service: Not on file    Active member of club or organization: Not on file    Attends meetings of clubs or organizations: Not on file    Relationship status: Not on file  . Intimate partner violence    Fear of current or ex partner: Not on file    Emotionally abused: Not on file    Physically abused: Not on file    Forced sexual activity: Not on file  Other Topics Concern  . Not on file  Social History Narrative  . Not on file    Allergies  Allergen Reactions  . Penicillins Swelling and Rash    Current Outpatient Medications  Medication Sig Dispense Refill  . Cholecalciferol (VITAMIN D3) 5000 UNITS TABS Take 5,000-20,000 Units by mouth daily. Dose varies depending on patient exposure to sunlight    .  ibuprofen (ADVIL,MOTRIN) 200 MG tablet Take 600 mg by mouth every 6 (six) hours as needed for pain.    Marland Kitchen KRILL OIL PO Take by mouth.    . Multiple Vitamin (MULTIVITAMIN WITH MINERALS) TABS tablet Take 1 tablet by mouth daily.     Marland Kitchen OVER THE COUNTER MEDICATION Take 180 mcg by mouth daily. Vitamin k mk-7    . QUERCETIN PO Take by mouth.     Current Facility-Administered Medications  Medication Dose Route Frequency Provider Last Rate Last Dose  . 0.9 %  sodium chloride infusion  500 mL Intravenous Once Jackquline Denmark, MD        ROS:   General:  No weight loss, Fever, chills  HEENT: No recent headaches, no nasal bleeding, no visual changes, no sore throat  Neurologic: No dizziness, blackouts, seizures. No recent symptoms of stroke or mini- stroke. No recent episodes of slurred speech, or temporary blindness.  Cardiac: No recent episodes of chest pain/pressure, no shortness of breath at rest.  No shortness of breath with exertion.  +  irregular heartbeat  Vascular: No history of rest pain in feet.  No history of claudication.  No history of non-healing ulcer, No history of DVT   Pulmonary: No home oxygen, no productive cough, no hemoptysis,  No asthma or wheezing  Musculoskeletal:  [ ]  Arthritis, [ ]  Low back pain,  [ ]  Joint pain  Hematologic:No history of hypercoagulable state.  No history of easy bleeding.  No history of anemia  Gastrointestinal: No hematochezia or melena,  No gastroesophageal reflux, no trouble swallowing  Urinary: [ ]  chronic Kidney disease, [ ]  on HD - [ ]  MWF or [ ]  TTHS, [ ]  Burning with urination, [ ]  Frequent urination, [ ]  Difficulty urinating;   Skin: No rashes  Psychological: No history of anxiety,  No history of depression   Physical Examination  Vitals:   02/04/19 1058  BP: 137/90  Pulse: 86  Resp: 12  Temp: 98 F (36.7 C)  TempSrc: Temporal  SpO2: 97%  Weight: (!) 336 lb (152.4 kg)  Height: 6' (1.829 m)    Body mass index is 45.57 kg/m.  General:  Alert and oriented, no acute distress HEENT: Normal Neck: No JVD Cardiac: Regular Rate and Rhythm  Abdomen: Soft, non-tender, non-distended, no mass, obese  skin: No rash, few scattered spider type varicosities around the ankles and thigh bilaterally.  Large tortuous varicosity vein left medial thigh, images pictured below          Extremity Pulses:  2+ radial, brachial, femoral, dorsalis pedis, posterior tibial pulses bilaterally Musculoskeletal: No deformity or edema  Neurologic: Upper and lower extremity motor 5/5 and symmetric  DATA:  Patient had a venous reflux exam today which I reviewed and interpreted.  This showed diffuse reflux in the greater saphenous vein bilaterally with a 7 to 8 mm vein diameter.  He also had deep vein reflux in the femoral vein.  ASSESSMENT: Healed stasis ulcer right lower extremity secondary to venous reflux.  I discussed the patient today the pathophysiology of venous stasis  ulcers.  Also discussed with him pathophysiology of venous reflux.  I emphasized to him that these ulcers can be recurrent in nature and that certainly compression would reduce the risk of recurrence.  Also discussed with him the possibility of laser ablation.  We also talked about elevating the legs at the end of the day to decrease swelling.   PLAN: Patient will follow-up with me in 3  months time to see if he has had improvement with thigh-high compression stockings.  He was given a 20 to 25mmHg pair today that are thigh-high.  If he has not had improvement of his symptoms or has recurrent ulceration we could consider laser ablation of his greater saphenous vein.  However during today's conversation the patient was reluctant to have any intervention other than the compression stockings alone.  We also discussed weight loss today I discussed with him that 75% of vein problems improve with weight loss.  He will also try to work on this in the next few months.  Ruta Hinds, MD [facility] Office: [phone] Pager: [phone]

## 2019-02-14 ENCOUNTER — Encounter: Payer: Self-pay | Admitting: Gastroenterology

## 2019-03-04 DIAGNOSIS — M545 Low back pain: Secondary | ICD-10-CM | POA: Diagnosis not present

## 2019-03-04 DIAGNOSIS — Z125 Encounter for screening for malignant neoplasm of prostate: Secondary | ICD-10-CM | POA: Diagnosis not present

## 2019-03-04 DIAGNOSIS — N281 Cyst of kidney, acquired: Secondary | ICD-10-CM | POA: Diagnosis not present

## 2019-03-04 DIAGNOSIS — N200 Calculus of kidney: Secondary | ICD-10-CM | POA: Diagnosis not present

## 2019-05-05 DIAGNOSIS — G4733 Obstructive sleep apnea (adult) (pediatric): Secondary | ICD-10-CM | POA: Diagnosis not present

## 2019-05-20 ENCOUNTER — Ambulatory Visit: Payer: 59 | Admitting: Vascular Surgery

## 2019-05-26 ENCOUNTER — Encounter: Payer: Self-pay | Admitting: Vascular Surgery

## 2019-05-26 ENCOUNTER — Ambulatory Visit (INDEPENDENT_AMBULATORY_CARE_PROVIDER_SITE_OTHER): Payer: 59 | Admitting: Vascular Surgery

## 2019-05-26 ENCOUNTER — Other Ambulatory Visit: Payer: Self-pay

## 2019-05-26 VITALS — BP 139/83 | HR 95 | Temp 96.5°F | Resp 18 | Ht 75.0 in | Wt 339.0 lb

## 2019-05-26 DIAGNOSIS — I83813 Varicose veins of bilateral lower extremities with pain: Secondary | ICD-10-CM

## 2019-05-26 NOTE — Progress Notes (Signed)
Patient is a 53 year old male who returns for further follow-up today.  He was last seen August 2020.  At that time he had symptomatic varicose veins with bilateral skin changes.  He was given a prescription for long leg compression stockings.  He has been compliant wearing these.  He still has occasional skin irritation but overall says his symptoms are improved.  He sometimes has fullness heaviness and aching of the legs as the day progresses but this is relieved somewhat with leg elevation.  Review of systems: He has no shortness of breath.  He has no chest pain.  Current Outpatient Medications on File Prior to Visit  Medication Sig Dispense Refill  . Cholecalciferol (VITAMIN D3) 5000 UNITS TABS Take 5,000-20,000 Units by mouth daily. Dose varies depending on patient exposure to sunlight    . ibuprofen (ADVIL,MOTRIN) 200 MG tablet Take 600 mg by mouth every 6 (six) hours as needed for pain.    Marland Kitchen KRILL OIL PO Take by mouth.    . Multiple Vitamin (MULTIVITAMIN WITH MINERALS) TABS tablet Take 1 tablet by mouth daily.     Marland Kitchen OVER THE COUNTER MEDICATION Take 180 mcg by mouth daily. Vitamin k mk-7    . QUERCETIN PO Take by mouth.     Current Facility-Administered Medications on File Prior to Visit  Medication Dose Route Frequency Provider Last Rate Last Dose  . 0.9 %  sodium chloride infusion  500 mL Intravenous Once Jackquline Denmark, MD        Social History   Socioeconomic History  . Marital status: Single    Spouse name: Not on file  . Number of children: Not on file  . Years of education: Not on file  . Highest education level: Not on file  Occupational History  . Not on file  Social Needs  . Financial resource strain: Not on file  . Food insecurity    Worry: Not on file    Inability: Not on file  . Transportation needs    Medical: Not on file    Non-medical: Not on file  Tobacco Use  . Smoking status: Never Smoker  . Smokeless tobacco: Never Used  Substance and Sexual Activity   . Alcohol use: Yes    Comment: rarely  . Drug use: No  . Sexual activity: Not on file  Lifestyle  . Physical activity    Days per week: Not on file    Minutes per session: Not on file  . Stress: Not on file  Relationships  . Social Herbalist on phone: Not on file    Gets together: Not on file    Attends religious service: Not on file    Active member of club or organization: Not on file    Attends meetings of clubs or organizations: Not on file    Relationship status: Not on file  . Intimate partner violence    Fear of current or ex partner: Not on file    Emotionally abused: Not on file    Physically abused: Not on file    Forced sexual activity: Not on file  Other Topics Concern  . Not on file  Social History Narrative  . Not on file    Physical exam:  Vitals:   05/26/19 1411  BP: 139/83  Pulse: 95  Resp: 18  Temp: (!) 96.5 F (35.8 C)  TempSrc: Temporal  SpO2: 96%  Weight: (!) 339 lb (153.8 kg)  Height: 6\' 3"  (1.905 m)  Extremities: Bilateral hemosiderin staining right greater than left palpable pedal pulses, large areas of varicosities bilateral lower extremities 4 to 6 mm diameter primarily medial calf bilaterally  Assessment: Bilateral varicose veins symptoms currently improved with compression alone and elevation.  Patient is not currently interested in laser ablation.  Plan: Patient will follow-up with [place] on an as-needed basis if he wishes to pursue laser ablation at some point in future.  Otherwise he will continue to wear his compression stockings and elevate his legs.  Ruta Hinds, MD [facility] Office: [phone]

## 2019-07-30 DIAGNOSIS — I10 Essential (primary) hypertension: Secondary | ICD-10-CM | POA: Diagnosis not present

## 2019-07-30 DIAGNOSIS — G4730 Sleep apnea, unspecified: Secondary | ICD-10-CM | POA: Diagnosis not present

## 2019-12-14 DIAGNOSIS — Z125 Encounter for screening for malignant neoplasm of prostate: Secondary | ICD-10-CM | POA: Diagnosis not present

## 2019-12-14 DIAGNOSIS — Z13220 Encounter for screening for lipoid disorders: Secondary | ICD-10-CM | POA: Diagnosis not present

## 2019-12-14 DIAGNOSIS — Z0000 Encounter for general adult medical examination without abnormal findings: Secondary | ICD-10-CM | POA: Diagnosis not present

## 2019-12-14 DIAGNOSIS — M25512 Pain in left shoulder: Secondary | ICD-10-CM | POA: Diagnosis not present

## 2019-12-14 DIAGNOSIS — G4730 Sleep apnea, unspecified: Secondary | ICD-10-CM | POA: Diagnosis not present

## 2019-12-14 DIAGNOSIS — I10 Essential (primary) hypertension: Secondary | ICD-10-CM | POA: Diagnosis not present

## 2019-12-15 ENCOUNTER — Ambulatory Visit
Admission: RE | Admit: 2019-12-15 | Discharge: 2019-12-15 | Disposition: A | Discharge status: Home / Self Care | Payer: 59 | Source: Ambulatory Visit | Attending: Family Medicine | Admitting: Family Medicine

## 2019-12-15 ENCOUNTER — Other Ambulatory Visit: Payer: Self-pay | Admitting: Family Medicine

## 2019-12-15 DIAGNOSIS — M25512 Pain in left shoulder: Secondary | ICD-10-CM

## 2020-01-26 ENCOUNTER — Encounter: Payer: Self-pay | Admitting: Family Medicine

## 2020-01-26 ENCOUNTER — Ambulatory Visit: Payer: 59 | Admitting: Family Medicine

## 2020-01-26 ENCOUNTER — Other Ambulatory Visit: Payer: Self-pay

## 2020-01-26 DIAGNOSIS — M25512 Pain in left shoulder: Secondary | ICD-10-CM

## 2020-01-26 NOTE — Progress Notes (Signed)
Office Visit Note   Patient: Travis Fleming           Date of Birth: 1965/07/18           MRN: [id number] Visit Date: 01/26/2020 Requested by: Gaynelle Arabian, MD [address] [place],  [place] [address] PCP: Gaynelle Arabian, MD  Subjective: Chief Complaint  Patient presents with  . Left Shoulder - Pain    Achy pain in the shoulder. Pain only with certain movements. Cannot lie on that side. Pain x several months. No specific injury, but does lift weights sometimes.    HPI: 54yo M presenting to clinic with concerns of 6 months of left shoulder pain. Patient denies any trauma to the area, but states that his pain started when he began to lift weights in the gym. He has been trying to increase his activity, and was doing deadlifts when he felt a pain around his scapula and deep in his shoulder. States the pain is worse at night when he rolls onto his side, and when he's reaching across his body. Pain is hard to pinpoint, throughout his shoulder.  He has tried removing sugar from his diet to see if this would reduce his inflammation, however this offered only minimal improvement.  He was watching physical therapy videos online, and has been trying to do these exercises at home, and states that this has started to make it feel better. He wants to know that he can return to the gym without causing further injury to himself.               ROS:   All other systems were reviewed and are negative.  Objective: Vital Signs: There were no vitals taken for this visit.  Physical Exam:  General:  Alert and oriented, in no acute distress. Pulm:  Breathing unlabored. Psy:  Normal mood, congruent affect. Skin:  No bruises or rashes appreciated  LEFT SHOULDER:  Shoulders with symmetrical muscle mass. No scars.  B/L shoulders will full ROM. No pain with Abduction. Endorses some pain with extremes of internal rotation, however able to reach approx T7 on [place]ley scratch.  No TTP over [facility]  Joint. Mild TTP over biceps tendon on R. Tenderness throughout palpation of rhomboids posteriorly. Some tenderness with palpation of R superior trapezius.  No pain with palpation of distal biceps.  Mild pain with full strength on empty can. No pain, full strength with resisted external/internal rotaiton.  Negative Speeds/O'brien Negative Crank Negative Biceps load II Negative Yergsons Pain with Hawkins.  No pain with Neers.  Imaging: None today.  Assessment & Plan: 54yo M presenting to clinic with chronic L shoulder pain, which he states has slowly started to improve with HEP and sugar elimination diet. Suspect possible biceps tendonitis/rotator cuff impingement, as well as rhomboid trigger point as a source of his pain. Discussed physical therapy, however patient states he is happy with his current HEP program. Offered Voltaren gel, however patient declines. States "I'm not sure if medical intervention is really needed anymore. It's really starting to get better."  - Reassurance given that he can return to the gym as pain allows - RTC if pain worsens or fails to improve - Patient interested in establishing care as functional medicine patient. Will schedule for intake for this.  - Patient had no further questions or concerns.      Procedures: No procedures performed  No notes on file     PMFS History: Patient Active Problem List  Diagnosis Date Noted  . Diplopia 12/24/2017  . OSA on CPAP 12/24/2017   Past Medical History:  Diagnosis Date  . Allergy    seasonal  . Anemia    as a child  . History of double vision   . History of kidney stones   . Sleep apnea    on c-pap    Family History  Problem Relation Age of Onset  . Kidney disease Mother   . Cancer Sister   . Colon cancer Neg Hx   . Esophageal cancer Neg Hx   . Rectal cancer Neg Hx   . Stomach cancer Neg Hx   . Colon polyps Neg Hx     Past Surgical History:  Procedure Laterality Date  . COLONOSCOPY      Social History   Occupational History  . Not on file  Tobacco Use  . Smoking status: Never Smoker  . Smokeless tobacco: Never Used  Vaping Use  . Vaping Use: Never used  Substance and Sexual Activity  . Alcohol use: Yes    Comment: rarely  . Drug use: No  . Sexual activity: Not on file

## 2020-01-26 NOTE — Progress Notes (Signed)
I saw and examined the patient with Dr. Elouise Munroe and agree with assessment and plan as outlined.    Improving left shoulder pain, possibly long head biceps tendinopathy.  Will keep doing HEP, try Voltaren gel.  PT if symptoms worsen again.  Also wants to schedule visit for functional medicine.  Found my name online while listening to [employer] podcast.

## 2020-02-03 ENCOUNTER — Ambulatory Visit: Payer: 59 | Admitting: Family Medicine

## 2020-02-03 ENCOUNTER — Other Ambulatory Visit: Payer: Self-pay

## 2020-02-03 ENCOUNTER — Encounter: Payer: Self-pay | Admitting: Family Medicine

## 2020-02-03 VITALS — BP 132/78 | HR 93 | Ht 74.0 in | Wt 300.0 lb

## 2020-02-03 DIAGNOSIS — R600 Localized edema: Secondary | ICD-10-CM

## 2020-02-03 DIAGNOSIS — R739 Hyperglycemia, unspecified: Secondary | ICD-10-CM | POA: Diagnosis not present

## 2020-02-03 DIAGNOSIS — E6609 Other obesity due to excess calories: Secondary | ICD-10-CM | POA: Diagnosis not present

## 2020-02-03 DIAGNOSIS — Z6838 Body mass index (BMI) 38.0-38.9, adult: Secondary | ICD-10-CM

## 2020-02-03 NOTE — Progress Notes (Signed)
Office Visit Note   Patient: Travis Fleming           Date of Birth: 1966/05/12           MRN: [id number] Visit Date: 02/03/2020 Requested by: Gaynelle Arabian, MD [address] [place] [address] PCP: Gaynelle Arabian, MD  Subjective: Chief Complaint  Patient presents with  . functional medicine consult    HPI: Is here to discuss obesity.  He states that he has been overweight for many years, unable to lose weight.  For the past couple years he has tried ketogenic diet, intermittent fasting, and other methods with no significant weight loss.  His target weight is 230 pounds, the last time he weighed that much was in college.  He overall has good energy.  He had stool testing done once which was unremarkable.  He does not know his family history.  He has never had his testosterone checked to his knowledge.  He does note that he has been testing his blood sugars at home and they tend to run in the early prediabetes range.  He does have a history of venous insufficiency in his legs.               ROS: Denies any troubles with constipation or diarrhea.  He is not having any rash, no joint pains other than his shoulder which we discussed last visit.  All other systems were reviewed and are negative.  Objective: Vital Signs: BP 132/78   Pulse 93   Ht 6\' 2"  (1.88 m)   Wt 300 lb (136.1 kg)   BMI 38.52 kg/m   Physical Exam:  General:  Alert and oriented, in no acute distress. Pulm:  Breathing unlabored. Psy:  Normal mood, congruent affect.  Neck: No thyromegaly or nodules, no carotid bruits. CV: Regular rate and rhythm without murmurs, rubs, or gallops.  1-2+ bilateral lower extremity peripheral edema.  2+ radial and posterior tibial pulses. Lungs: Clear to auscultation throughout with no wheezing or areas of consolidation. Extremities: No nail deformities.    Imaging: No results found.  Assessment & Plan: 1.  Obesity -We will draw some labs to  investigate. - Consider adding probiotics depending on results.  2.  Hyperglycemia -We will check hemoglobin A1c and insulin levels.      Procedures: No procedures performed  No notes on file     PMFS History: Patient Active Problem List   Diagnosis Date Noted  . Diplopia 12/24/2017  . OSA on CPAP 12/24/2017   Past Medical History:  Diagnosis Date  . Allergy    seasonal  . Anemia    as a child  . History of double vision   . History of kidney stones   . Sleep apnea    on c-pap    Family History  Problem Relation Age of Onset  . Kidney disease Mother   . Cancer Sister   . Colon cancer Neg Hx   . Esophageal cancer Neg Hx   . Rectal cancer Neg Hx   . Stomach cancer Neg Hx   . Colon polyps Neg Hx     Past Surgical History:  Procedure Laterality Date  . COLONOSCOPY     Social History   Occupational History  . Not on file  Tobacco Use  . Smoking status: Never Smoker  . Smokeless tobacco: Never Used  Vaping Use  . Vaping Use: Never used  Substance and Sexual Activity  . Alcohol  use: Yes    Comment: rarely  . Drug use: No  . Sexual activity: Not on file

## 2020-02-04 ENCOUNTER — Other Ambulatory Visit: Payer: Self-pay | Admitting: Family Medicine

## 2020-02-04 MED ORDER — FREESTYLE LIBRE SENSOR SYSTEM MISC: 11 refills | Status: AC

## 2020-02-06 LAB — CBC WITH DIFFERENTIAL/PLATELET
Absolute Monocytes: 519 cells/uL (ref 200–950)
Basophils Absolute: 67 cells/uL (ref 0–200)
Basophils Relative: 1.1 %
Eosinophils Absolute: 201 cells/uL (ref 15–500)
Eosinophils Relative: 3.3 %
HCT: 46.4 % (ref 38.5–50.0)
Hemoglobin: 15.1 g/dL (ref 13.2–17.1)
Lymphs Abs: 2464 cells/uL (ref 850–3900)
MCH: 28.8 pg (ref 27.0–33.0)
MCHC: 32.5 g/dL (ref 32.0–36.0)
MCV: 88.5 fL (ref 80.0–100.0)
MPV: 12.1 fL (ref 7.5–12.5)
Monocytes Relative: 8.5 %
Neutro Abs: 2849 cells/uL (ref 1500–7800)
Neutrophils Relative %: 46.7 %
Platelets: 191 10*3/uL (ref 140–400)
RBC: 5.24 10*6/uL (ref 4.20–5.80)
RDW: 12.7 % (ref 11.0–15.0)
Total Lymphocyte: 40.4 %
WBC: 6.1 10*3/uL (ref 3.8–10.8)

## 2020-02-06 LAB — COMPREHENSIVE METABOLIC PANEL
AG Ratio: 1.7 (calc) (ref 1.0–2.5)
ALT: 20 U/L (ref 9–46)
AST: 16 U/L (ref 10–35)
Albumin: 4.7 g/dL (ref 3.6–5.1)
Alkaline phosphatase (APISO): 58 U/L (ref 35–144)
BUN: 16 mg/dL (ref 7–25)
CO2: 26 mmol/L (ref 20–32)
Calcium: 10.2 mg/dL (ref 8.6–10.3)
Chloride: 103 mmol/L (ref 98–110)
Creat: 0.94 mg/dL (ref 0.70–1.33)
Globulin: 2.7 g/dL (calc) (ref 1.9–3.7)
Glucose, Bld: 102 mg/dL — ABNORMAL HIGH (ref 65–99)
Potassium: 4.7 mmol/L (ref 3.5–5.3)
Sodium: 140 mmol/L (ref 135–146)
Total Bilirubin: 0.5 mg/dL (ref 0.2–1.2)
Total Protein: 7.4 g/dL (ref 6.1–8.1)

## 2020-02-06 LAB — HIGH SENSITIVITY CRP: hs-CRP: >10 mg/L — ABNORMAL HIGH

## 2020-02-06 LAB — ANTI-NUCLEAR AB-TITER (ANA TITER): ANA Titer 1: 1:40 {titer} — ABNORMAL HIGH

## 2020-02-06 LAB — TESTOSTERONE TOTAL,FREE,BIO, MALES
Albumin: 4.7 g/dL (ref 3.6–5.1)
Sex Hormone Binding: 8 nmol/L — ABNORMAL LOW (ref 22–77)
Testosterone: 126 ng/dL — ABNORMAL LOW (ref 250–827)

## 2020-02-06 LAB — THYROID PANEL WITH TSH
Free Thyroxine Index: 2.1 (ref 1.4–3.8)
T3 Uptake: 37 % — ABNORMAL HIGH (ref 22–35)
T4, Total: 5.7 ug/dL (ref 4.9–10.5)
TSH: 0.95 mIU/L (ref 0.40–4.50)

## 2020-02-06 LAB — LIPID PANEL
Cholesterol: 150 mg/dL (ref <200)
HDL: 34 mg/dL — ABNORMAL LOW (ref >=40)
LDL Cholesterol (Calc): 94 mg/dL (calc)
Non-HDL Cholesterol (Calc): 116 mg/dL (calc) (ref <130)
Total CHOL/HDL Ratio: 4.4 (calc) (ref <5.0)
Triglycerides: 121 mg/dL (ref <150)

## 2020-02-06 LAB — HEMOGLOBIN A1C
Hgb A1c MFr Bld: 5.5 % of total Hgb (ref <5.7)
Mean Plasma Glucose: 111 (calc)
eAG (mmol/L): 6.2 (calc)

## 2020-02-06 LAB — THYROID PEROXIDASE ANTIBODY: Thyroperoxidase Ab SerPl-aCnc: 1 IU/mL (ref <9)

## 2020-02-06 LAB — ANA: Anti Nuclear Antibody (ANA): POSITIVE — AB

## 2020-02-06 LAB — INSULIN, RANDOM: Insulin: 18.7 u[IU]/mL

## 2020-02-06 LAB — VITAMIN D 25 HYDROXY (VIT D DEFICIENCY, FRACTURES): Vit D, 25-Hydroxy: 56 ng/mL (ref 30–100)

## 2020-02-06 LAB — D-DIMER, QUANTITATIVE: D-Dimer, Quant: 0.4 mcg/mL FEU (ref <0.50)

## 2020-02-06 LAB — SEDIMENTATION RATE: Sed Rate: 6 mm/h (ref 0–20)

## 2020-02-07 ENCOUNTER — Telehealth: Payer: Self-pay | Admitting: Family Medicine

## 2020-02-07 DIAGNOSIS — R7982 Elevated C-reactive protein (CRP): Secondary | ICD-10-CM

## 2020-02-07 NOTE — Telephone Encounter (Signed)
Labs show:  ANA is positive with a low titer of 1:40.  CRP (inflammation marker) is also elevated.  Glucose is in prediabetes range at 102, and insulin is above 10 (also consistent with prediabetes).  A1C looks ok still.  Testosterone is very low.    Thyroid function looks good overall.  Other labs look good.

## 2020-02-08 ENCOUNTER — Other Ambulatory Visit: Payer: Self-pay | Admitting: Family Medicine

## 2020-02-08 MED ORDER — METFORMIN HCL 500 MG PO TABS: 500.0000 mg | ORAL_TABLET | Freq: Two times a day (BID) | ORAL | 6 refills | Status: DC

## 2020-02-08 NOTE — Addendum Note (Signed)
Addended by: Hortencia Pilar on: 02/08/2020 08:05 AM   Modules accepted: Orders

## 2020-03-01 NOTE — Telephone Encounter (Signed)
New message sent to the patient to schedule a time for the blood draw. Awaiting his response.

## 2020-03-01 NOTE — Addendum Note (Signed)
Addended by: Hortencia Pilar on: 03/01/2020 08:49 AM   Modules accepted: Orders

## 2020-03-06 ENCOUNTER — Ambulatory Visit (INDEPENDENT_AMBULATORY_CARE_PROVIDER_SITE_OTHER): Payer: 59

## 2020-03-06 ENCOUNTER — Other Ambulatory Visit: Payer: Self-pay

## 2020-03-06 DIAGNOSIS — R7982 Elevated C-reactive protein (CRP): Secondary | ICD-10-CM

## 2020-03-06 NOTE — Progress Notes (Signed)
Lab visit: hsCRP drawn per Dr. Junius Roads

## 2020-03-07 ENCOUNTER — Telehealth: Payer: Self-pay | Admitting: Family Medicine

## 2020-03-07 LAB — HIGH SENSITIVITY CRP: hs-CRP: 8 mg/L — ABNORMAL HIGH

## 2020-03-07 NOTE — Telephone Encounter (Signed)
CRP elevated, but better at 8.0.

## 2020-09-07 ENCOUNTER — Other Ambulatory Visit (HOSPITAL_BASED_OUTPATIENT_CLINIC_OR_DEPARTMENT_OTHER): Payer: Self-pay

## 2020-09-08 ENCOUNTER — Other Ambulatory Visit (HOSPITAL_BASED_OUTPATIENT_CLINIC_OR_DEPARTMENT_OTHER): Payer: Self-pay

## 2020-10-16 ENCOUNTER — Other Ambulatory Visit (HOSPITAL_COMMUNITY): Payer: Self-pay

## 2020-10-16 MED FILL — Metformin HCl Tab 500 MG: ORAL | 30 days supply | Qty: 60 | Fill #0 | Status: AC

## 2020-11-15 DIAGNOSIS — Z0000 Encounter for general adult medical examination without abnormal findings: Secondary | ICD-10-CM | POA: Diagnosis not present

## 2020-11-15 DIAGNOSIS — M25512 Pain in left shoulder: Secondary | ICD-10-CM | POA: Diagnosis not present

## 2020-11-15 DIAGNOSIS — Z13220 Encounter for screening for lipoid disorders: Secondary | ICD-10-CM | POA: Diagnosis not present

## 2020-11-15 DIAGNOSIS — I10 Essential (primary) hypertension: Secondary | ICD-10-CM | POA: Diagnosis not present

## 2020-11-15 DIAGNOSIS — G4730 Sleep apnea, unspecified: Secondary | ICD-10-CM | POA: Diagnosis not present

## 2020-11-27 ENCOUNTER — Other Ambulatory Visit (HOSPITAL_COMMUNITY): Payer: Self-pay

## 2020-11-27 MED FILL — Metformin HCl Tab 500 MG: ORAL | 30 days supply | Qty: 60 | Fill #1 | Status: AC

## 2020-12-07 DIAGNOSIS — G4733 Obstructive sleep apnea (adult) (pediatric): Secondary | ICD-10-CM | POA: Diagnosis not present

## 2021-01-04 ENCOUNTER — Other Ambulatory Visit: Payer: Self-pay | Admitting: Family Medicine

## 2021-01-05 ENCOUNTER — Other Ambulatory Visit (HOSPITAL_COMMUNITY): Payer: Self-pay

## 2021-01-08 ENCOUNTER — Other Ambulatory Visit: Payer: Self-pay | Admitting: Family Medicine

## 2021-01-08 ENCOUNTER — Other Ambulatory Visit (HOSPITAL_COMMUNITY): Payer: Self-pay

## 2021-01-12 ENCOUNTER — Other Ambulatory Visit (HOSPITAL_COMMUNITY): Payer: Self-pay

## 2021-01-12 ENCOUNTER — Other Ambulatory Visit: Payer: Self-pay | Admitting: Family Medicine

## 2021-01-15 ENCOUNTER — Other Ambulatory Visit (HOSPITAL_COMMUNITY): Payer: Self-pay

## 2021-01-17 ENCOUNTER — Other Ambulatory Visit (HOSPITAL_COMMUNITY): Payer: Self-pay

## 2021-01-17 ENCOUNTER — Other Ambulatory Visit: Payer: Self-pay | Admitting: Family Medicine

## 2021-01-18 ENCOUNTER — Other Ambulatory Visit (HOSPITAL_COMMUNITY): Payer: Self-pay

## 2021-01-19 ENCOUNTER — Other Ambulatory Visit (HOSPITAL_COMMUNITY): Payer: Self-pay

## 2021-01-19 ENCOUNTER — Other Ambulatory Visit: Payer: Self-pay | Admitting: Family Medicine

## 2021-01-22 ENCOUNTER — Other Ambulatory Visit: Payer: Self-pay | Admitting: Family Medicine

## 2021-01-22 ENCOUNTER — Other Ambulatory Visit (HOSPITAL_COMMUNITY): Payer: Self-pay

## 2021-04-13 DIAGNOSIS — N200 Calculus of kidney: Secondary | ICD-10-CM | POA: Diagnosis not present

## 2021-04-13 DIAGNOSIS — M545 Low back pain, unspecified: Secondary | ICD-10-CM | POA: Diagnosis not present

## 2021-04-13 DIAGNOSIS — Z125 Encounter for screening for malignant neoplasm of prostate: Secondary | ICD-10-CM | POA: Diagnosis not present

## 2021-04-13 DIAGNOSIS — N281 Cyst of kidney, acquired: Secondary | ICD-10-CM | POA: Diagnosis not present

## 2021-04-25 DIAGNOSIS — N281 Cyst of kidney, acquired: Secondary | ICD-10-CM | POA: Diagnosis not present

## 2021-05-19 IMAGING — CR DG SHOULDER 2+V*L*
3 series · 3 of 3 positions shown · non-contrast
Comparison: None.

CLINICAL DATA: Left shoulder pain.

EXAM:
LEFT SHOULDER - 2+ VIEW

[w shoulder ap internal left *]
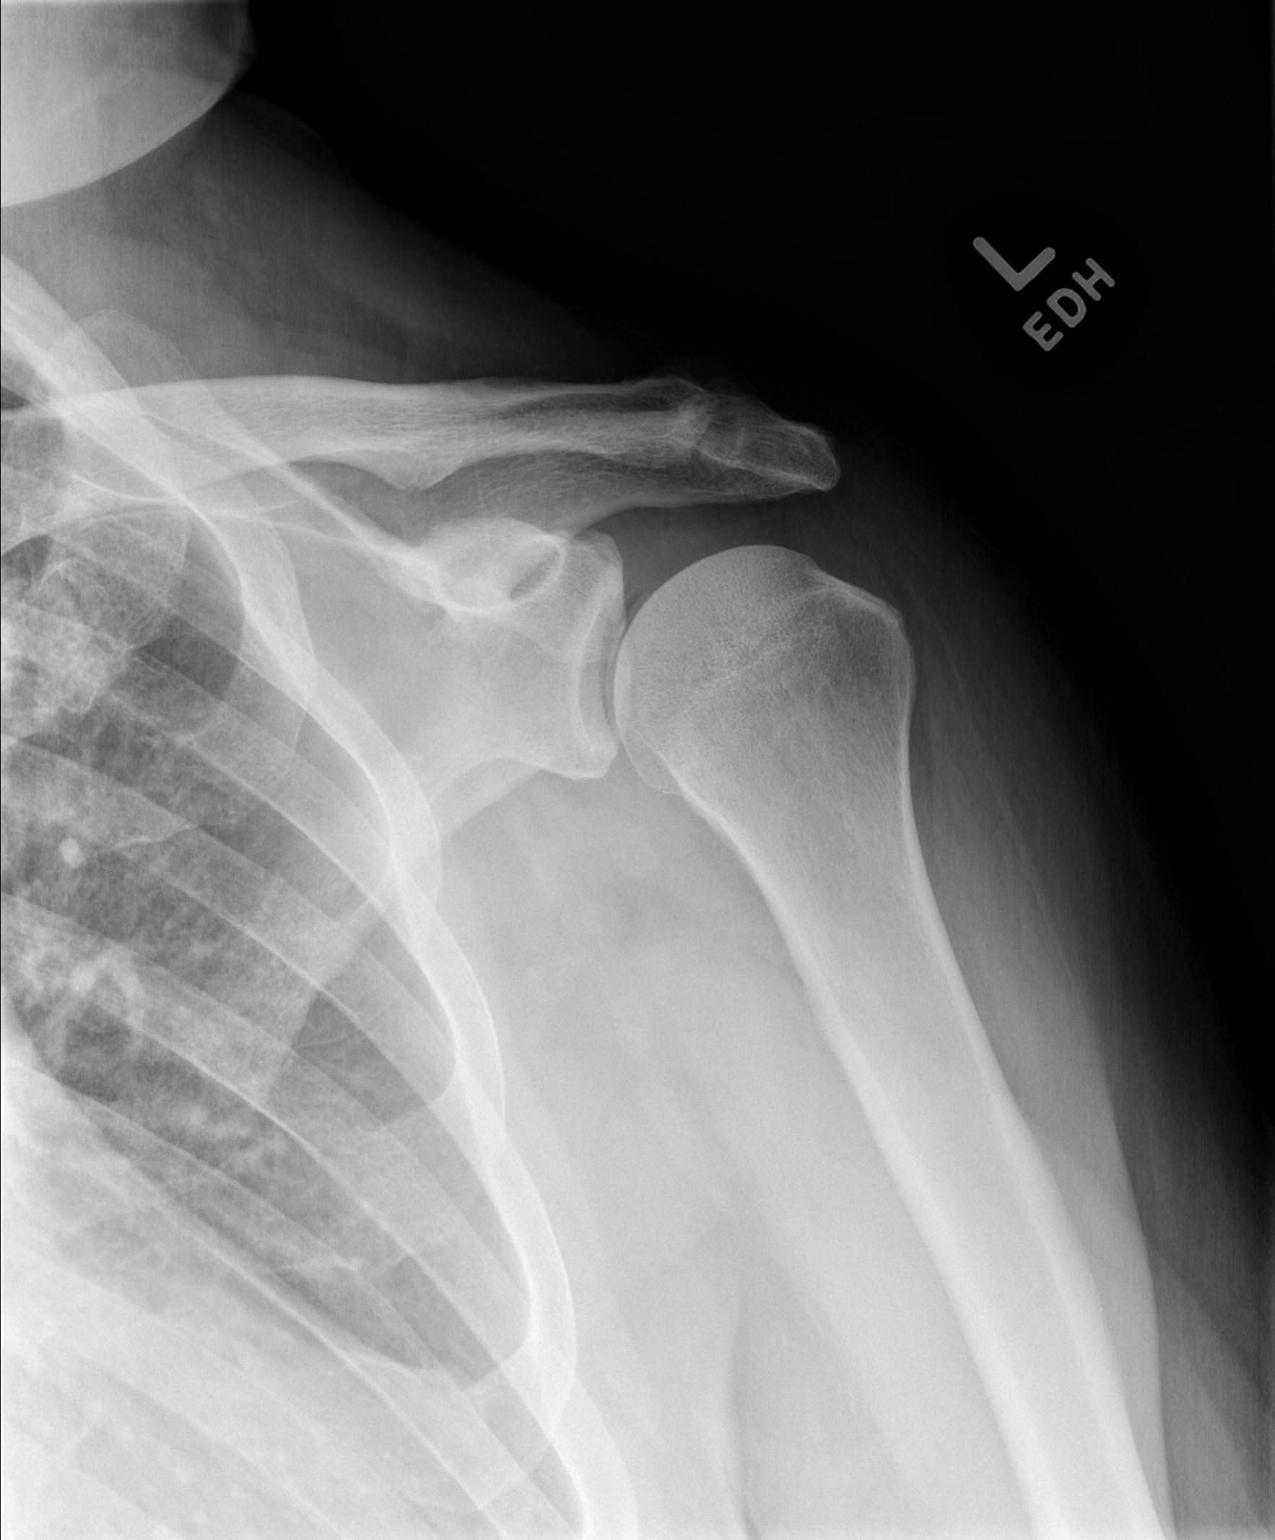

[w shoulder y view left *]
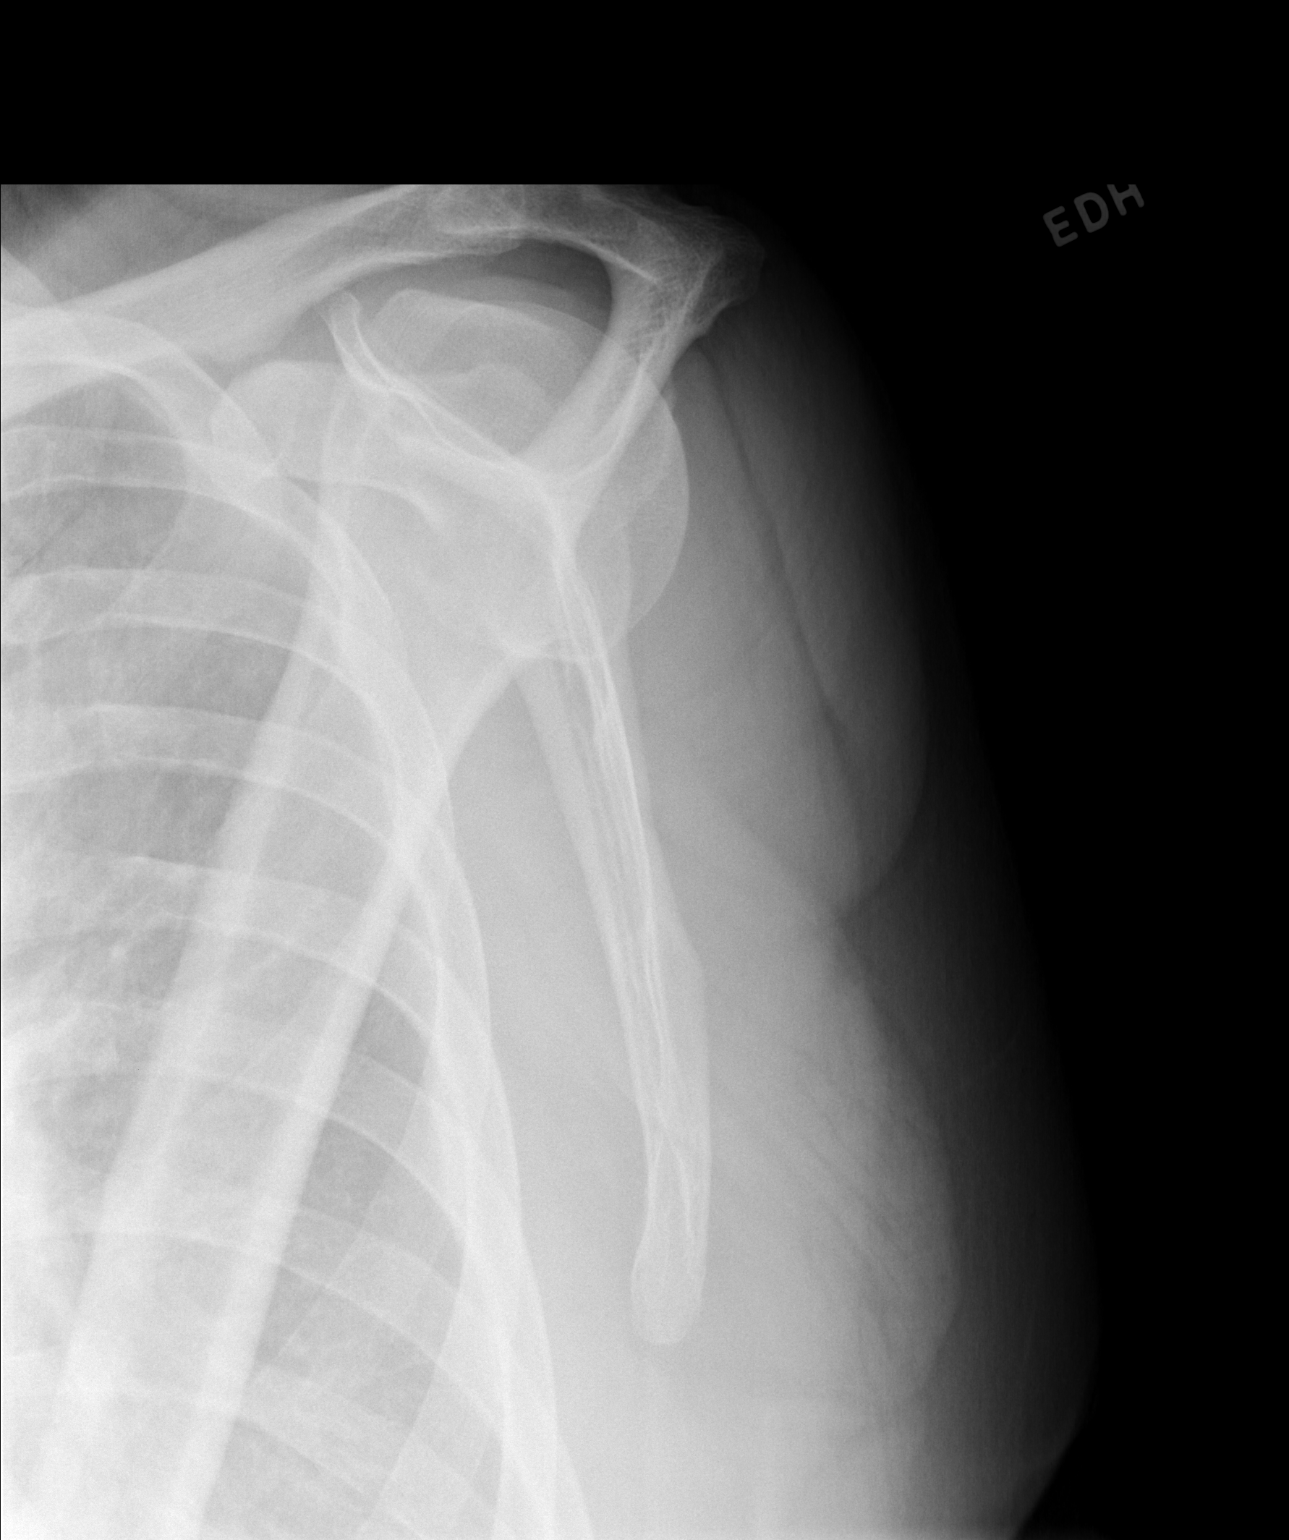

[w shoulder axillary left *]
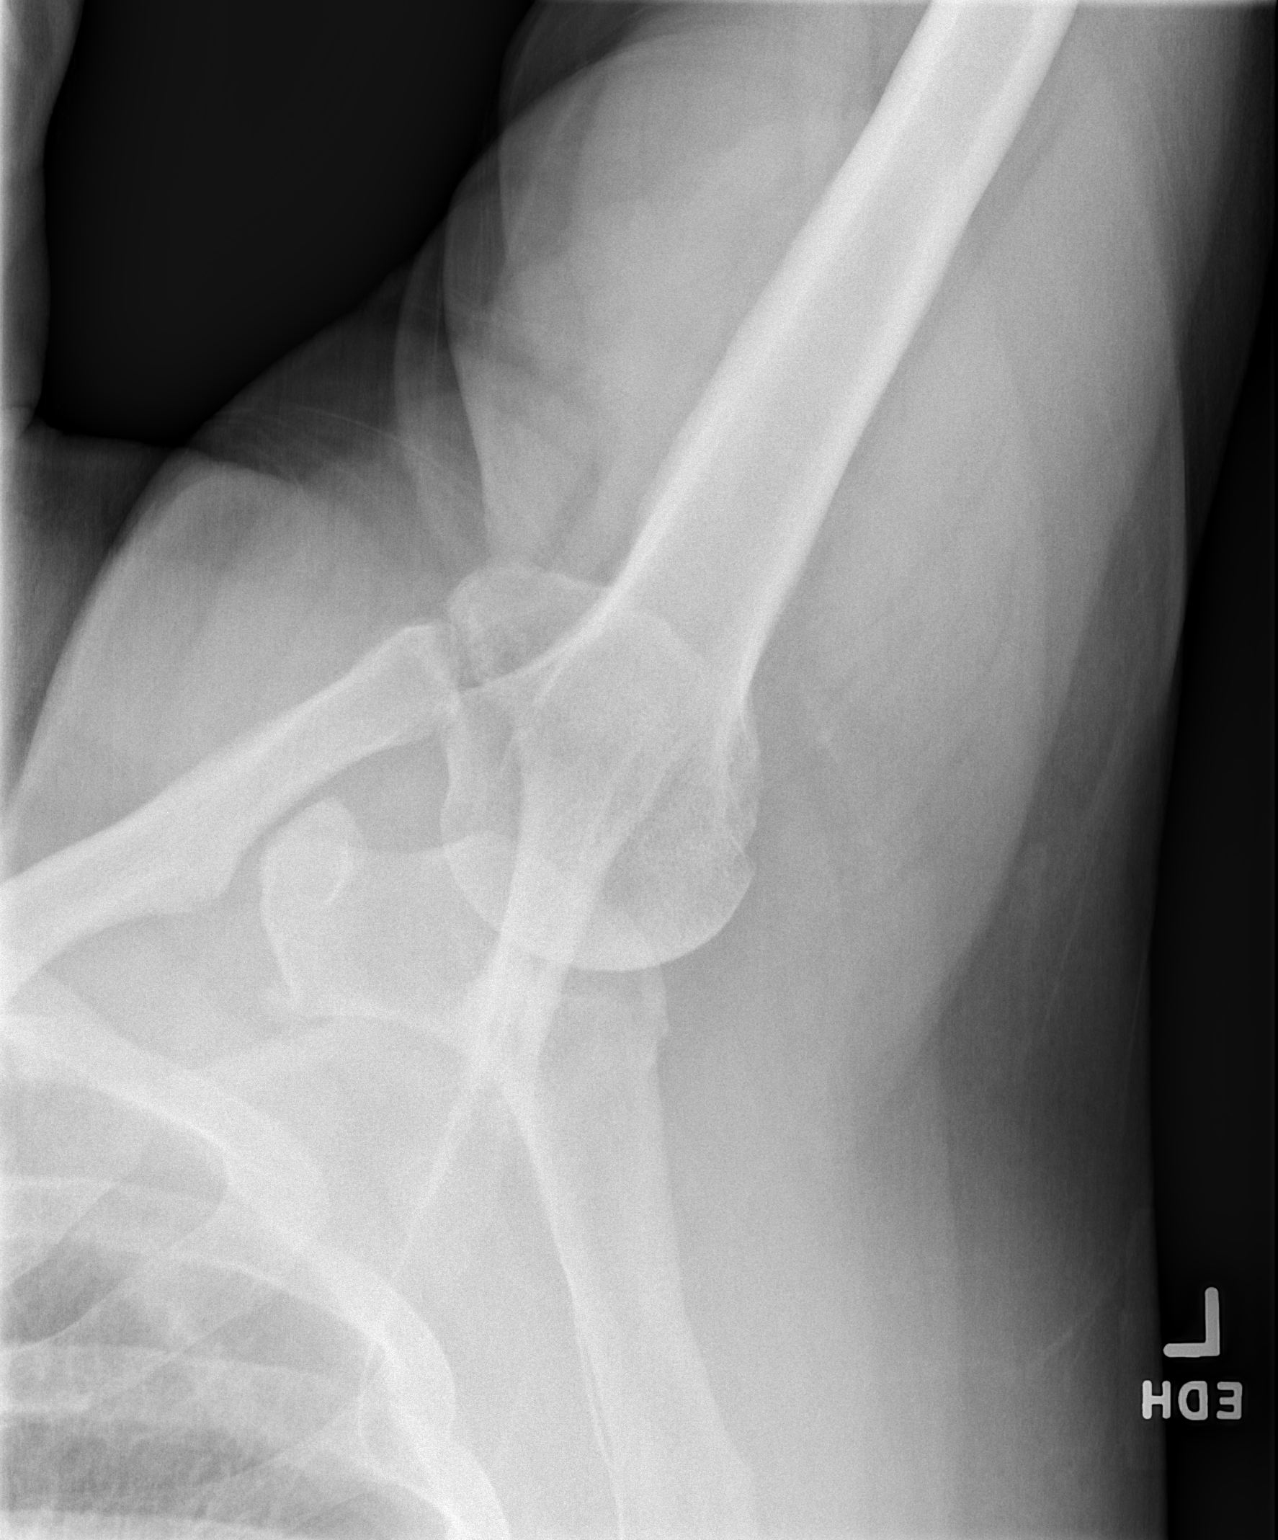

[3 of 3 positions shown; findings below may reference images not displayed]

FINDINGS: There is no evidence of fracture or dislocation. There is no
evidence of arthropathy or other focal bone abnormality. Soft
tissues are unremarkable.
IMPRESSION: Negative.

## 2021-10-18 DIAGNOSIS — G4733 Obstructive sleep apnea (adult) (pediatric): Secondary | ICD-10-CM | POA: Diagnosis not present

## 2021-11-15 DIAGNOSIS — E291 Testicular hypofunction: Secondary | ICD-10-CM | POA: Diagnosis not present

## 2021-11-15 DIAGNOSIS — M25512 Pain in left shoulder: Secondary | ICD-10-CM | POA: Diagnosis not present

## 2021-11-15 DIAGNOSIS — I10 Essential (primary) hypertension: Secondary | ICD-10-CM | POA: Diagnosis not present

## 2021-11-15 DIAGNOSIS — G4730 Sleep apnea, unspecified: Secondary | ICD-10-CM | POA: Diagnosis not present

## 2021-11-15 DIAGNOSIS — Z0000 Encounter for general adult medical examination without abnormal findings: Secondary | ICD-10-CM | POA: Diagnosis not present

## 2021-11-15 DIAGNOSIS — Z125 Encounter for screening for malignant neoplasm of prostate: Secondary | ICD-10-CM | POA: Diagnosis not present

## 2021-11-16 DIAGNOSIS — E291 Testicular hypofunction: Secondary | ICD-10-CM | POA: Diagnosis not present

## 2021-11-16 DIAGNOSIS — I10 Essential (primary) hypertension: Secondary | ICD-10-CM | POA: Diagnosis not present

## 2021-11-16 DIAGNOSIS — Z125 Encounter for screening for malignant neoplasm of prostate: Secondary | ICD-10-CM | POA: Diagnosis not present

## 2022-01-30 DIAGNOSIS — E291 Testicular hypofunction: Secondary | ICD-10-CM | POA: Diagnosis not present

## 2022-01-30 DIAGNOSIS — R946 Abnormal results of thyroid function studies: Secondary | ICD-10-CM | POA: Diagnosis not present

## 2022-03-13 DIAGNOSIS — E291 Testicular hypofunction: Secondary | ICD-10-CM | POA: Diagnosis not present

## 2022-03-13 DIAGNOSIS — R946 Abnormal results of thyroid function studies: Secondary | ICD-10-CM | POA: Diagnosis not present

## 2022-04-02 DIAGNOSIS — E0590 Thyrotoxicosis, unspecified without thyrotoxic crisis or storm: Secondary | ICD-10-CM | POA: Diagnosis not present

## 2022-04-02 DIAGNOSIS — E291 Testicular hypofunction: Secondary | ICD-10-CM | POA: Diagnosis not present

## 2022-04-05 ENCOUNTER — Other Ambulatory Visit (HOSPITAL_COMMUNITY): Payer: Self-pay | Admitting: Endocrinology

## 2022-04-05 DIAGNOSIS — E0590 Thyrotoxicosis, unspecified without thyrotoxic crisis or storm: Secondary | ICD-10-CM

## 2022-04-23 ENCOUNTER — Encounter (HOSPITAL_COMMUNITY): Payer: 59

## 2022-04-23 ENCOUNTER — Encounter (HOSPITAL_COMMUNITY): Admission: RE | Admit: 2022-04-23 | Payer: 59 | Source: Ambulatory Visit

## 2022-04-23 DIAGNOSIS — G4733 Obstructive sleep apnea (adult) (pediatric): Secondary | ICD-10-CM | POA: Diagnosis not present

## 2022-04-24 ENCOUNTER — Encounter (HOSPITAL_COMMUNITY): Payer: 59

## 2022-04-25 DIAGNOSIS — N200 Calculus of kidney: Secondary | ICD-10-CM | POA: Diagnosis not present

## 2022-04-25 DIAGNOSIS — N281 Cyst of kidney, acquired: Secondary | ICD-10-CM | POA: Diagnosis not present

## 2022-04-25 DIAGNOSIS — Z125 Encounter for screening for malignant neoplasm of prostate: Secondary | ICD-10-CM | POA: Diagnosis not present

## 2022-05-06 ENCOUNTER — Encounter (HOSPITAL_COMMUNITY): Payer: Self-pay

## 2022-05-06 ENCOUNTER — Encounter (HOSPITAL_COMMUNITY): Payer: 59

## 2022-05-07 ENCOUNTER — Encounter (HOSPITAL_COMMUNITY): Payer: 59

## 2022-05-07 ENCOUNTER — Encounter (HOSPITAL_COMMUNITY): Payer: Self-pay

## 2022-07-23 DIAGNOSIS — E0590 Thyrotoxicosis, unspecified without thyrotoxic crisis or storm: Secondary | ICD-10-CM | POA: Diagnosis not present

## 2022-07-23 DIAGNOSIS — E291 Testicular hypofunction: Secondary | ICD-10-CM | POA: Diagnosis not present

## 2023-06-13 ENCOUNTER — Other Ambulatory Visit: Payer: Self-pay

## 2023-06-13 ENCOUNTER — Other Ambulatory Visit (HOSPITAL_COMMUNITY): Payer: Self-pay

## 2023-06-13 MED ORDER — SULFAMETHOXAZOLE-TRIMETHOPRIM 800-160 MG PO TABS
1.0000 | ORAL_TABLET | Freq: Two times a day (BID) | ORAL | 0 refills | Status: DC
  Filled 2023-06-13: qty 14, 7d supply, fill #0

## 2023-06-17 ENCOUNTER — Other Ambulatory Visit: Payer: Self-pay

## 2023-06-25 ENCOUNTER — Other Ambulatory Visit: Payer: Self-pay | Admitting: Urology

## 2023-06-27 NOTE — Progress Notes (Signed)
 Sent message, via epic in basket, requesting orders in epic from Careers adviser.

## 2023-06-27 NOTE — Patient Instructions (Signed)
 DUE TO COVID-19 ONLY TWO VISITORS  (aged 60 and older)  ARE ALLOWED TO COME WITH YOU AND STAY IN THE WAITING ROOM ONLY DURING PRE OP AND PROCEDURE.   **NO VISITORS ARE ALLOWED IN THE SHORT STAY AREA OR RECOVERY ROOM!!**  IF YOU WILL BE ADMITTED INTO THE HOSPITAL YOU ARE ALLOWED ONLY FOUR SUPPORT PEOPLE DURING VISITATION HOURS ONLY (7 AM -8PM)   The support person(s) must pass our screening, gel in and out, and wear a mask at all times, including in the patient's room. Patients must also wear a mask when staff or their support person are in the room. Visitors GUEST BADGE MUST BE WORN VISIBLY  One adult visitor may remain with you overnight and MUST be in the room by 8 P.M.     Your procedure is scheduled on: 07/04/23   Report to [facility] Main Entrance    Report to admitting at : 9:45 AM   Call this number if you have problems the morning of surgery [phone]   Do not eat food :After Midnight.   After Midnight you may have the following liquids until : 9:00 AM DAY OF SURGERY  Water Black Coffee (sugar ok, NO MILK/CREAM OR CREAMERS)  Tea (sugar ok, NO MILK/CREAM OR CREAMERS) regular and decaf                             Plain Jell-O (NO RED)                                           Fruit ices (not with fruit pulp, NO RED)                                     Popsicles (NO RED)                                                                  Juice: apple, WHITE grape, WHITE cranberry Sports drinks like Gatorade (NO RED)              FOLLOW ANY ADDITIONAL PRE OP INSTRUCTIONS YOU RECEIVED FROM YOUR SURGEON'S OFFICE!!!   Oral Hygiene is also important to reduce your risk of infection.                                    Remember - BRUSH YOUR TEETH THE MORNING OF SURGERY WITH YOUR REGULAR TOOTHPASTE  DENTURES WILL BE REMOVED PRIOR TO SURGERY PLEASE DO NOT APPLY Poly grip OR ADHESIVES!!!   Do NOT smoke after Midnight   Take these medicines the morning of surgery with A  SIP OF WATER: NONE  DO NOT TAKE ANY ORAL DIABETIC MEDICATIONS DAY OF YOUR SURGERY                              You may not have any metal on your body including hair pins, jewelry, and  body piercing             Do not wear lotions, powders, perfumes/cologne, or deodorant              Men may shave face and neck.   Do not bring valuables to the hospital. [facility] IS NOT             RESPONSIBLE   FOR VALUABLES.   Contacts, glasses, or bridgework may not be worn into surgery.   Bring small overnight bag day of surgery.   DO NOT BRING YOUR HOME MEDICATIONS TO THE HOSPITAL. PHARMACY WILL DISPENSE MEDICATIONS LISTED ON YOUR MEDICATION LIST TO YOU DURING YOUR ADMISSION IN THE HOSPITAL!    Patients discharged on the day of surgery will not be allowed to drive home.  Someone NEEDS to stay with you for the first 24 hours after anesthesia.   Special Instructions: Bring a copy of your healthcare power of attorney and living will documents         the day of surgery if you haven't scanned them before.              Please read over the following fact sheets you were given: IF YOU HAVE QUESTIONS ABOUT YOUR PRE-OP INSTRUCTIONS PLEASE CALL [phone]    [facility] - Preparing for Surgery Before surgery, you can play an important role.  Because skin is not sterile, your skin needs to be as free of germs as possible.  You can reduce the number of germs on your skin by washing with CHG (chlorahexidine gluconate) soap before surgery.  CHG is an antiseptic cleaner which kills germs and bonds with the skin to continue killing germs even after washing. Please DO NOT use if you have an allergy to CHG or antibacterial soaps.  If your skin becomes reddened/irritated stop using the CHG and inform your nurse when you arrive at Short Stay. Do not shave (including legs and underarms) for at least 48 hours prior to the first CHG shower.  You may shave your face/neck. Please follow these instructions  carefully:  1.  Shower with CHG Soap the night before surgery and the  morning of Surgery.  2.  If you choose to wash your hair, wash your hair first as usual with your  normal  shampoo.  3.  After you shampoo, rinse your hair and body thoroughly to remove the  shampoo.                           4.  Use CHG as you would any other liquid soap.  You can apply chg directly  to the skin and wash                       Gently with a scrungie or clean washcloth.  5.  Apply the CHG Soap to your body ONLY FROM THE NECK DOWN.   Do not use on face/ open                           Wound or open sores. Avoid contact with eyes, ears mouth and genitals (private parts).                       Wash face,  Genitals (private parts) with your normal soap.  6.  Wash thoroughly, paying special attention to the area where your surgery  will be performed.  7.  Thoroughly rinse your body with warm water from the neck down.  8.  DO NOT shower/wash with your normal soap after using and rinsing off  the CHG Soap.                9.  Pat yourself dry with a clean towel.            10.  Wear clean pajamas.            11.  Place clean sheets on your bed the night of your first shower and do not  sleep with pets. Day of Surgery : Do not apply any lotions/deodorants the morning of surgery.  Please wear clean clothes to the hospital/surgery center.  FAILURE TO FOLLOW THESE INSTRUCTIONS MAY RESULT IN THE CANCELLATION OF YOUR SURGERY PATIENT SIGNATURE_________________________________  NURSE SIGNATURE__________________________________  ________________________________________________________________________[facility] - Preparing for Surgery Before surgery, you can play an important role.  Because skin is not sterile, your skin needs to be as free of germs as possible.  You can reduce the number of germs on your skin by washing with CHG (chlorahexidine gluconate) soap before surgery.  CHG is an antiseptic cleaner which  kills germs and bonds with the skin to continue killing germs even after washing. Please DO NOT use if you have an allergy to CHG or antibacterial soaps.  If your skin becomes reddened/irritated stop using the CHG and inform your nurse when you arrive at Short Stay. Do not shave (including legs and underarms) for at least 48 hours prior to the first CHG shower.  You may shave your face/neck. Please follow these instructions carefully:  1.  Shower with CHG Soap the night before surgery and the  morning of Surgery.  2.  If you choose to wash your hair, wash your hair first as usual with your  normal  shampoo.  3.  After you shampoo, rinse your hair and body thoroughly to remove the  shampoo.                           4.  Use CHG as you would any other liquid soap.  You can apply chg directly  to the skin and wash                       Gently with a scrungie or clean washcloth.  5.  Apply the CHG Soap to your body ONLY FROM THE NECK DOWN.   Do not use on face/ open                           Wound or open sores. Avoid contact with eyes, ears mouth and genitals (private parts).                       Wash face,  Genitals (private parts) with your normal soap.             6.  Wash thoroughly, paying special attention to the area where your surgery  will be performed.  7.  Thoroughly rinse your body with warm water from the neck down.  8.  DO NOT shower/wash with your normal soap after using and rinsing off  the CHG Soap.  9.  Pat yourself dry with a clean towel.            10.  Wear clean pajamas.            11.  Place clean sheets on your bed the night of your first shower and do not  sleep with pets. Day of Surgery : Do not apply any lotions/deodorants the morning of surgery.  Please wear clean clothes to the hospital/surgery center.  FAILURE TO FOLLOW THESE INSTRUCTIONS MAY RESULT IN THE CANCELLATION OF YOUR SURGERY PATIENT SIGNATURE_________________________________  NURSE  SIGNATURE__________________________________  ________________________________________________________________________

## 2023-07-01 ENCOUNTER — Other Ambulatory Visit: Payer: Self-pay

## 2023-07-01 ENCOUNTER — Encounter (HOSPITAL_COMMUNITY)
Admission: RE | Admit: 2023-07-01 | Discharge: 2023-07-01 | Disposition: A | Discharge status: Home / Self Care | Payer: Commercial Managed Care - PPO | Source: Ambulatory Visit | Attending: Urology | Admitting: Urology

## 2023-07-01 ENCOUNTER — Encounter (HOSPITAL_COMMUNITY): Payer: Self-pay

## 2023-07-01 VITALS — BP 139/90 | HR 84 | Temp 98.0°F | Ht 74.0 in | Wt 314.0 lb

## 2023-07-01 DIAGNOSIS — Z01812 Encounter for preprocedural laboratory examination: Secondary | ICD-10-CM | POA: Diagnosis not present

## 2023-07-01 DIAGNOSIS — Z01818 Encounter for other preprocedural examination: Secondary | ICD-10-CM | POA: Diagnosis present

## 2023-07-01 LAB — CBC
HCT: 47.5 % (ref 39.0–52.0)
Hemoglobin: 15.2 g/dL (ref 13.0–17.0)
MCH: 28 pg (ref 26.0–34.0)
MCHC: 32 g/dL (ref 30.0–36.0)
MCV: 87.5 fL (ref 80.0–100.0)
Platelets: 218 10*3/uL (ref 150–400)
RBC: 5.43 MIL/uL (ref 4.22–5.81)
RDW: 13.1 % (ref 11.5–15.5)
WBC: 7.2 10*3/uL (ref 4.0–10.5)
nRBC: 0 % (ref 0.0–0.2)

## 2023-07-01 NOTE — Progress Notes (Signed)
 For Anesthesia: PCP - Hugh Charleston MD. LOV: 12/18/22 Cardiologist - N/A  Bowel Prep reminder:  Chest x-ray -  EKG -  Stress Test -  ECHO -  Cardiac Cath -  Pacemaker/ICD device last checked: Pacemaker orders received: Device Rep notified:  Spinal Cord Stimulator:N/A  Sleep Study - Yes CPAP - Yes  Fasting Blood Sugar - N/A Checks Blood Sugar _____ times a day Date and result of last Hgb A1c-  Last dose of GLP1 agonist- N/A GLP1 instructions:   Last dose of SGLT-2 inhibitors- N/A SGLT-2 instructions:   Blood Thinner Instructions:N/A Aspirin Instructions: Last Dose:  Activity level: Can go up a flight of stairs and activities of daily living without stopping and without chest pain and/or shortness of breath   Able to exercise without chest pain and/or shortness of breath  Anesthesia review: Hx: OSA(CPAP)  Patient denies shortness of breath, fever, cough and chest pain at PAT appointment   Patient verbalized understanding of instructions that were given to them at the PAT appointment. Patient was also instructed that they will need to review over the PAT instructions again at home before surgery.

## 2023-07-04 ENCOUNTER — Encounter (HOSPITAL_COMMUNITY)
Admission: RE | Disposition: A | Discharge status: Home / Self Care | Payer: Self-pay | Source: Ambulatory Visit | Attending: Urology

## 2023-07-04 ENCOUNTER — Ambulatory Visit (HOSPITAL_COMMUNITY): Payer: Managed Care, Other (non HMO) | Admitting: Anesthesiology

## 2023-07-04 ENCOUNTER — Encounter (HOSPITAL_COMMUNITY): Payer: Self-pay | Admitting: Urology

## 2023-07-04 ENCOUNTER — Ambulatory Visit (HOSPITAL_COMMUNITY): Payer: Managed Care, Other (non HMO)

## 2023-07-04 ENCOUNTER — Other Ambulatory Visit (HOSPITAL_COMMUNITY): Payer: Self-pay

## 2023-07-04 ENCOUNTER — Ambulatory Visit (HOSPITAL_BASED_OUTPATIENT_CLINIC_OR_DEPARTMENT_OTHER): Payer: Managed Care, Other (non HMO) | Admitting: Anesthesiology

## 2023-07-04 ENCOUNTER — Other Ambulatory Visit: Payer: Self-pay

## 2023-07-04 ENCOUNTER — Ambulatory Visit (HOSPITAL_COMMUNITY)
Admission: RE | Admit: 2023-07-04 | Discharge: 2023-07-04 | Disposition: A | Discharge status: Home / Self Care | Payer: Managed Care, Other (non HMO) | Source: Ambulatory Visit | Attending: Urology | Admitting: Urology

## 2023-07-04 DIAGNOSIS — G4730 Sleep apnea, unspecified: Secondary | ICD-10-CM | POA: Diagnosis not present

## 2023-07-04 DIAGNOSIS — Z87442 Personal history of urinary calculi: Secondary | ICD-10-CM | POA: Insufficient documentation

## 2023-07-04 DIAGNOSIS — N201 Calculus of ureter: Secondary | ICD-10-CM | POA: Insufficient documentation

## 2023-07-04 DIAGNOSIS — N281 Cyst of kidney, acquired: Secondary | ICD-10-CM | POA: Diagnosis not present

## 2023-07-04 DIAGNOSIS — E66813 Obesity, class 3: Secondary | ICD-10-CM | POA: Insufficient documentation

## 2023-07-04 DIAGNOSIS — Z6841 Body Mass Index (BMI) 40.0 and over, adult: Secondary | ICD-10-CM | POA: Insufficient documentation

## 2023-07-04 HISTORY — PX: CYSTOSCOPY/URETEROSCOPY/HOLMIUM LASER/STENT PLACEMENT: SHX6546

## 2023-07-04 LAB — BASIC METABOLIC PANEL
Anion gap: 11 (ref 5–15)
BUN: 15 mg/dL (ref 6–20)
CO2: 22 mmol/L (ref 22–32)
Calcium: 9.3 mg/dL (ref 8.9–10.3)
Chloride: 104 mmol/L (ref 98–111)
Creatinine, Ser: 0.67 mg/dL (ref 0.61–1.24)
GFR, Estimated: >60 mL/min (ref >60)
Glucose, Bld: 108 mg/dL — ABNORMAL HIGH (ref 70–99)
Potassium: 3.6 mmol/L (ref 3.5–5.1)
Sodium: 137 mmol/L (ref 135–145)

## 2023-07-04 SURGERY — CYSTOSCOPY/URETEROSCOPY/HOLMIUM LASER/STENT PLACEMENT
Anesthesia: General | Laterality: Left

## 2023-07-04 MED ORDER — KETOROLAC TROMETHAMINE 10 MG PO TABS
10.0000 mg | ORAL_TABLET | Freq: Three times a day (TID) | ORAL | 0 refills | Status: AC | PRN
  Filled 2023-07-04: qty 20, 7d supply, fill #0

## 2023-07-04 MED ORDER — ONDANSETRON HCL 4 MG/2ML IJ SOLN
INTRAMUSCULAR | Status: DC | PRN
  Administered 2023-07-04: 4 mg via INTRAVENOUS

## 2023-07-04 MED ORDER — ORAL CARE MOUTH RINSE: 15.0000 mL | Freq: Once | OROMUCOSAL | Status: AC

## 2023-07-04 MED ORDER — PROPOFOL 10 MG/ML IV BOLUS
INTRAVENOUS | Status: AC
  Filled 2023-07-04: qty 20

## 2023-07-04 MED ORDER — ACETAMINOPHEN 10 MG/ML IV SOLN: 1000.0000 mg | Freq: Once | INTRAVENOUS | Status: DC | PRN

## 2023-07-04 MED ORDER — ROCURONIUM BROMIDE 100 MG/10ML IV SOLN
INTRAVENOUS | Status: DC | PRN
  Administered 2023-07-04: 20 mg via INTRAVENOUS
  Administered 2023-07-04: 50 mg via INTRAVENOUS

## 2023-07-04 MED ORDER — SULFAMETHOXAZOLE-TRIMETHOPRIM 800-160 MG PO TABS
1.0000 | ORAL_TABLET | Freq: Two times a day (BID) | ORAL | 0 refills | Status: AC
  Filled 2023-07-04: qty 6, 3d supply, fill #0

## 2023-07-04 MED ORDER — GENTAMICIN SULFATE 40 MG/ML IJ SOLN
5.0000 mg/kg | INTRAVENOUS | Status: AC
  Administered 2023-07-04: 531.6 mg via INTRAVENOUS
  Filled 2023-07-04: qty 13.25

## 2023-07-04 MED ORDER — PROPOFOL 10 MG/ML IV BOLUS
INTRAVENOUS | Status: DC | PRN
  Administered 2023-07-04: 200 mg via INTRAVENOUS

## 2023-07-04 MED ORDER — FENTANYL CITRATE (PF) 100 MCG/2ML IJ SOLN
INTRAMUSCULAR | Status: AC
  Filled 2023-07-04: qty 2

## 2023-07-04 MED ORDER — SODIUM CHLORIDE 0.9 % IR SOLN
Status: DC | PRN
  Administered 2023-07-04: 3000 mL via INTRAVESICAL

## 2023-07-04 MED ORDER — 0.9 % SODIUM CHLORIDE (POUR BTL) OPTIME
TOPICAL | Status: DC | PRN
  Administered 2023-07-04: 1000 mL

## 2023-07-04 MED ORDER — CHLORHEXIDINE GLUCONATE 0.12 % MT SOLN
15.0000 mL | Freq: Once | OROMUCOSAL | Status: AC
  Administered 2023-07-04: 15 mL via OROMUCOSAL

## 2023-07-04 MED ORDER — IOHEXOL 300 MG/ML  SOLN
INTRAMUSCULAR | Status: DC | PRN
  Administered 2023-07-04: 25 mL

## 2023-07-04 MED ORDER — OXYCODONE HCL 5 MG/5ML PO SOLN: 5.0000 mg | Freq: Once | ORAL | Status: DC | PRN

## 2023-07-04 MED ORDER — OXYCODONE HCL 5 MG PO TABS
5.0000 mg | ORAL_TABLET | Freq: Four times a day (QID) | ORAL | 0 refills | Status: AC | PRN
  Filled 2023-07-04: qty 15, 4d supply, fill #0

## 2023-07-04 MED ORDER — DEXAMETHASONE SODIUM PHOSPHATE 10 MG/ML IJ SOLN
INTRAMUSCULAR | Status: AC
Start: 2023-07-04 — End: ?
  Filled 2023-07-04: qty 1

## 2023-07-04 MED ORDER — LACTATED RINGERS IV SOLN: INTRAVENOUS | Status: DC

## 2023-07-04 MED ORDER — SENNOSIDES-DOCUSATE SODIUM 8.6-50 MG PO TABS
1.0000 | ORAL_TABLET | Freq: Two times a day (BID) | ORAL | 0 refills | Status: AC
  Filled 2023-07-04: qty 10, 5d supply, fill #0

## 2023-07-04 MED ORDER — DEXAMETHASONE SODIUM PHOSPHATE 4 MG/ML IJ SOLN
INTRAMUSCULAR | Status: DC | PRN
  Administered 2023-07-04: 8 mg via INTRAVENOUS

## 2023-07-04 MED ORDER — AMISULPRIDE (ANTIEMETIC) 5 MG/2ML IV SOLN: 10.0000 mg | Freq: Once | INTRAVENOUS | Status: DC | PRN

## 2023-07-04 MED ORDER — ONDANSETRON HCL 4 MG/2ML IJ SOLN
INTRAMUSCULAR | Status: AC
  Filled 2023-07-04: qty 2

## 2023-07-04 MED ORDER — KETOROLAC TROMETHAMINE 30 MG/ML IJ SOLN: 30.0000 mg | Freq: Once | INTRAMUSCULAR | Status: DC | PRN

## 2023-07-04 MED ORDER — SUGAMMADEX SODIUM 200 MG/2ML IV SOLN
INTRAVENOUS | Status: DC | PRN
  Administered 2023-07-04: 300 mg via INTRAVENOUS

## 2023-07-04 MED ORDER — SODIUM CHLORIDE 0.9 % IV SOLN: INTRAVENOUS | Status: DC

## 2023-07-04 MED ORDER — OXYCODONE HCL 5 MG PO TABS: 5.0000 mg | ORAL_TABLET | Freq: Once | ORAL | Status: DC | PRN

## 2023-07-04 MED ORDER — MIDAZOLAM HCL 2 MG/2ML IJ SOLN
INTRAMUSCULAR | Status: AC
  Filled 2023-07-04: qty 2

## 2023-07-04 MED ORDER — LIDOCAINE HCL (CARDIAC) PF 100 MG/5ML IV SOSY
PREFILLED_SYRINGE | INTRAVENOUS | Status: DC | PRN
  Administered 2023-07-04: 100 mg via INTRAVENOUS

## 2023-07-04 MED ORDER — FENTANYL CITRATE PF 50 MCG/ML IJ SOSY: 25.0000 ug | PREFILLED_SYRINGE | INTRAMUSCULAR | Status: DC | PRN

## 2023-07-04 MED ORDER — FENTANYL CITRATE (PF) 100 MCG/2ML IJ SOLN
INTRAMUSCULAR | Status: DC | PRN
  Administered 2023-07-04: 100 ug via INTRAVENOUS

## 2023-07-04 MED ORDER — ROCURONIUM BROMIDE 10 MG/ML (PF) SYRINGE
PREFILLED_SYRINGE | INTRAVENOUS | Status: AC
  Filled 2023-07-04: qty 10

## 2023-07-04 SURGICAL SUPPLY — 20 items
BAG URO CATCHER STRL LF (MISCELLANEOUS) ×1 IMPLANT
BASKET LASER NITINOL 1.9FR (BASKET) IMPLANT
CATH URETL OPEN END 6FR 70 (CATHETERS) ×1 IMPLANT
CLOTH BEACON ORANGE TIMEOUT ST (SAFETY) ×1 IMPLANT
EXTRACTOR STONE 1.7FRX115CM (UROLOGICAL SUPPLIES) IMPLANT
GLOVE SURG LX STRL 7.5 STRW (GLOVE) ×1 IMPLANT
GOWN STRL REUS W/ TWL XL LVL3 (GOWN DISPOSABLE) ×1 IMPLANT
GUIDEWIRE ANG ZIPWIRE 038X150 (WIRE) ×1 IMPLANT
GUIDEWIRE STR DUAL SENSOR (WIRE) ×1 IMPLANT
KIT TURNOVER KIT A (KITS) IMPLANT
LASER FIB FLEXIVA PULSE ID 365 (Laser) IMPLANT
MANIFOLD NEPTUNE II (INSTRUMENTS) ×1 IMPLANT
PACK CYSTO (CUSTOM PROCEDURE TRAY) ×1 IMPLANT
SHEATH NAVIGATOR HD 11/13X28 (SHEATH) IMPLANT
SHEATH NAVIGATOR HD 11/13X36 (SHEATH) IMPLANT
STENT POLARIS 5FRX26 (STENTS) IMPLANT
TRACTIP FLEXIVA PULS ID 200XHI (Laser) IMPLANT
TUBE PU 8FR 16IN ENFIT (TUBING) ×1 IMPLANT
TUBING CONNECTING 10 (TUBING) ×1 IMPLANT
TUBING UROLOGY SET (TUBING) ×1 IMPLANT

## 2023-07-04 NOTE — Anesthesia Preprocedure Evaluation (Addendum)
Anesthesia Evaluation  Patient identified by MRN, date of birth, ID band Patient awake    Reviewed: Allergy & Precautions, NPO status , Patient's Chart, lab work & pertinent test results  Airway Mallampati: III  TM Distance: >3 FB Neck ROM: Full    Dental no notable dental hx.    Pulmonary sleep apnea and Continuous Positive Airway Pressure Ventilation    Pulmonary exam normal        Cardiovascular negative cardio ROS Normal cardiovascular exam     Neuro/Psych negative neurological ROS  negative psych ROS   GI/Hepatic negative GI ROS, Neg liver ROS,,,  Endo/Other    Class 3 obesity  Renal/GU negative Renal ROS     Musculoskeletal negative musculoskeletal ROS (+)    Abdominal  (+) + obese  Peds  Hematology negative hematology ROS (+)   Anesthesia Other Findings  LEFT URETERAL STONE, CYST  Reproductive/Obstetrics                             Anesthesia Physical Anesthesia Plan  ASA: 3  Anesthesia Plan: General   Post-op Pain Management:    Induction: Intravenous  PONV Risk Score and Plan: 3 and Ondansetron, Dexamethasone, Midazolam and Treatment may vary due to age or medical condition  Airway Management Planned:   Additional Equipment:   Intra-op Plan:   Post-operative Plan: Extubation in OR  Informed Consent: I have reviewed the patients History and Physical, chart, labs and discussed the procedure including the risks, benefits and alternatives for the proposed anesthesia with the patient or authorized representative who has indicated his/her understanding and acceptance.       Plan Discussed with: CRNA  Anesthesia Plan Comments:        Anesthesia Quick Evaluation

## 2023-07-04 NOTE — Transfer of Care (Signed)
Immediate Anesthesia Transfer of Care Note  Patient: Travis Fleming  Procedure(s) Performed: CYSTOSCOPY/LEFT RETROGRADE PYELOGRAM/URETEROSCOPY/HOLMIUM LASER/STENT PLACEMENT/CYST MARSUPIALIZATION (Left)  Patient Location: PACU  Anesthesia Type:General  Level of Consciousness: awake  Airway & Oxygen Therapy: Patient Spontanous Breathing and Patient connected to face mask oxygen  Post-op Assessment: Report given to RN and Post -op Vital signs reviewed and stable  Post vital signs: Reviewed and stable  Last Vitals:  Vitals Value Taken Time  BP 127/86 07/04/23 1304  Temp 36.5 C 07/04/23 1304  Pulse 83 07/04/23 1305  Resp 11 07/04/23 1305  SpO2 100 % 07/04/23 1305  Vitals shown include unfiled device data.  Last Pain:  Vitals:   07/04/23 1001  TempSrc: Oral  PainSc:          Complications: No notable events documented.

## 2023-07-04 NOTE — Op Note (Unsigned)
Travis Fleming, Travis Fleming MEDICAL RECORD NO: [id number] ACCOUNT NO: [id number] DATE OF BIRTH: 1965/12/08 FACILITY: [facility] LOCATION: [facility] PHYSICIAN: Sebastian Ache, MD  Operative Report   DATE OF PROCEDURE: 07/04/2023  PREOPERATIVE DIAGNOSES: 1.  Left ureteral stone. 2.  Left renal cyst with mass effect.  PROCEDURE PERFORMED: 1.  Cystoscopy with left retrograde pyelogram interpretation. 2.  Left ureteroscopy with laser lithotripsy. 3.  Marsupialization of left renal cyst.  ESTIMATED BLOOD LOSS:  Nil.  COMPLICATIONS:  None.  SPECIMENS:  Left ureteral stone for composite analysis.  FINDINGS: 1.  Left distal ureteral stone as expected with mild hydronephrosis. 2.  Simple-appearing left renal cyst with mass effect on lower pole infundibulum. 3.  Successful incision and marsupialization of left renal cyst communicating with lower pole infundibulum after incision. 4.  Inability to visualize extreme lower pole renal stone given severe acute angulation.  This area is nonobstructing and will be observed.  DESCRIPTION OF PROCEDURE:  The patient is a 58 year old man with a history of recurrent urolithiasis.  He was found to have on workup of colicky flank pain having a new distal left ureteral stone that was quite large at around 9-10 mm.  He also has a  known extreme lower pole renal stone as well as an enlarging noncomplex renal cyst, but it does have some mass effect on the lower pole infundibulum likely causing some urinary stasis and possibly increasing his stone risk.  Options were discussed for  management including recommended path of left ureteroscopy with goal of stone-free.  I also discussed with him the utility of cyst marsupialization with the goal of relieving mass effect on the kidney allowing better renal drainage, hopefully, decreased  stone recurrence.  He wished to proceed with this as well.  Informed consent was obtained and placed in medical record.  PROCEDURE IN DETAIL:   The patient was identified and verified.  Procedure being left ureteroscopic stone manipulation with cyst marsupialization was confirmed.  Procedure time out was performed.  Intravenous antibiotics administered.  General LMA  anesthesia introduced. The patient was placed into a low lithotomy position.  Sterile field was created, prepped and draped in the patient's penis, perineum and proximal thigh using iodine.  Cystourethroscopy performed using 21-French rigid cystoscope  with offset lens.  Inspection of the anterior and posterior urethra were found to be unremarkable.  Inspection of the urinary bladder revealed no diverticula, calcifications, or papillary lesions.  Left ureteral orifice was cannulated with a 6-French  end-hole catheter and a left retrograde pyelogram was obtained.  Left retrograde pyelogram demonstrated single left ureter, single system left kidney as anticipated.  There was mild hydronephrosis and a large filling defect in the distal ureter consistent of stone.  A 0.038 ZIPwire was advanced to the level of upper  pole set aside as a safety wire.  An 8-French feeding tube placed in the urinary bladder for pressure release.  A semi-rigid ureteroscopy was performed distal left ureter alongside as separate sensory working wire just above the intramural ureter, a  large ovoid stone was encountered.  This was much too larger for simple basketing. Holmium laser was to applied to stone using a setting of 0.3 joules and 60 Hz and approximately 50% of the stone dusted, 50% fragmented.  Fortunately, the distal ureter  was quite capacious and antegrade flow from the kidney allowed for complete stone passage fragments to the bladder, which was irrigated out the cystoscope set aside for composite analysis.  Semirigid ureteroscopy was then continued thus  allowing  inspection of the distal two-thirds of the left ureter.  The semirigid ureteroscope was then exchanged for a medium-length ureteral access  sheath to the level of the mid ureter using continuous fluoroscopic guidance and flexible digital ureteroscopy was  performed of the proximal left ureter and systematic inspection of the left kidney.  As anticipated, there was extreme angulation in visualizing the true lower pole despite multiple attempts including a backboard technique.  The true lower pole was  unable to be visualized all the way to the small area of stone collection.  However, the lower pole infundibulum was easily visualized for at least 50% of its length.  There was significant mass effect on the lower infundibulum as anticipated from the  cyst and there did appear to be favorable angulation for cyst marsupialization.  Anterior-posterior triangulation was performed by placing a hemostat on the anterior abdominal wall under fluoroscopic vision twisting the scope to confirm anterior location  in the renal collecting system.  Next, an incision was made in the anterior aspect of the lower pole infundibulum using settings of 1.5 joules and 6 Hz.  An incision was made approximately 1 cm to 1.2 cm in length allowing communication to a large, but  non-complex cyst cavity.  The cyst cavity contained only simple fluid.  There were no septations and I was quite happy with the safety and success of the marsupialization of the cyst.  Despite the initial marsupialization, there was no at least immediate  drastic reduction in angulation of the lower pole and multiple attempts were again made to access the lower pole stone; however, this was unsuccessful.  I am still quite happy with achieving two out of the three goals of the surgery today, thus  addressing his obstructing stone and cyst.  We will observe his lower pole renal stone and consider reattempt should there be significant stone progression in the future.  The ureteroscope was removed under continuous vision, no significant mucosal  abnormalities were found.  Finally, a new 5 x 26 polaris  type stent was securely placed over the remaining safety wire.  Using fluoroscopic guidance, good proximal and distal plane were noted.  Tether was left in place fashioned to the dorsum of the  penis and the procedure was terminated.  The patient tolerated the procedure well.  No immediate periprocedural complications.  The patient was taken to the postanesthesia care unit in stable condition with plan for discharge home.   PUS D: 07/04/2023 12:56:38 pm T: 07/04/2023 2:31:00 pm  JOB: [id number]/ [id number]

## 2023-07-04 NOTE — Discharge Instructions (Addendum)
1 - You may have urinary urgency (bladder spasms) and bloody urine on / off with stent in place. This is normal.  2 - Remove tethered stent on Monday morning at home by pulling on string, then blue-white plastic tubing, and discarding. Office is open Monday if any issues arise.   3 - Call MD or go to ER for fever >102, severe pain / nausea / vomiting not relieved by medications, or acute change in medical status  

## 2023-07-04 NOTE — Brief Op Note (Signed)
07/04/2023  12:48 PM  PATIENT:  Travis Fleming  58 y.o. male  PRE-OPERATIVE DIAGNOSIS:  LEFT URETERAL STONE, CYST  POST-OPERATIVE DIAGNOSIS:  LEFT URETERAL STONE, CYST  PROCEDURE:  Procedure(s) with comments: CYSTOSCOPY/LEFT RETROGRADE PYELOGRAM/URETEROSCOPY/HOLMIUM LASER/STENT PLACEMENT/CYST MARSUPIALIZATION (Left) - 90 MINUTES NEEDED FOR CASE  SURGEON:  Surgeons and Role:    * Ivana Nicastro, Delbert Phenix., MD - Primary  PHYSICIAN ASSISTANT:   ASSISTANTS: none   ANESTHESIA:   general  EBL:  5 mL   BLOOD ADMINISTERED:none  DRAINS: none   LOCAL MEDICATIONS USED:  NONE  SPECIMEN:  Source of Specimen:  left ureteral stone fragments  DISPOSITION OF SPECIMEN:   given to patient  COUNTS:  YES  TOURNIQUET:  * No tourniquets in log *  DICTATION: .Other Dictation: Dictation Number [id number]  PLAN OF CARE: Discharge to home after PACU  PATIENT DISPOSITION:  PACU - hemodynamically stable.   Delay start of Pharmacological VTE agent (>24hrs) due to surgical blood loss or risk of bleeding: not applicable

## 2023-07-04 NOTE — H&P (Signed)
Travis Fleming is an 58 y.o. male.    Chief Complaint: Pre-OP LEFT Ureteroscopic Stone Manipulation + Renal cyst marsupialization  HPI:   1 - Recurrent Urolithias -  2014 - medical passage let ureteral stone, follow up CT 2015 stone free  05/2023 - left distal 11mm ureteral stone + LLP 12mm stone and large parapelvic cyst.   2 - Left Renal Cyst - Left 2-3cm cyst noted on ER CT 01/2013 and [place] 05/2013 but incompletly characterized. His mother died of renal insuficiency and he is undertandable anxious.   Recent Surveillance:  01/2018 - [place], KUB stable left renal cysts, new 4mm left lower pole renal stone.  02/2019 - [place], KUB stable left renal cysts and Left LP stone 5mm w/o hydro.  04/2022 - Renal US. KUB left cyst not 6cm longest axis and abtou 12mm sotne that appears to be in lower calyx obstructed from it.  05/2023 - CT stable 6cm left cyst and lower pole stone. Cyst abuts lower 1/2 collecting system anterior/laterally   PMH sig for obesity. No CV disease. No blood thinners. Diplopia (eval ongoing) No prior surgery. his PCP is Blair Heys MD.    Today Travis Fleming is seen to proceed with LEFT ureteroscopic stone manipulation + renal cyst marsupialization for distal ureteral stone, renal stone, and progressive mass effect from non-complex cyst. Most recent UCX normal.    Past Medical History:  Diagnosis Date   Allergy    seasonal   Anemia    as a child   History of double vision    History of kidney stones    Sleep apnea    on c-pap    Past Surgical History:  Procedure Laterality Date   COLONOSCOPY     EYE SURGERY Bilateral    lasic    Family History  Problem Relation Age of Onset   Kidney disease Mother    Cancer Sister    Colon cancer Neg Hx    Esophageal cancer Neg Hx    Rectal cancer Neg Hx    Stomach cancer Neg Hx    Colon polyps Neg Hx    Social History:  reports that he has never smoked. He has never used smokeless tobacco. He reports current alcohol use. He reports  that he does not use drugs.  Allergies:  Allergies  Allergen Reactions   Tape Other (See Comments)    Redness at the site of the tape placement   Penicillins Swelling and Rash    No medications prior to admission.    No results found for this or any previous visit (from the past 48 hours). No results found.  Review of Systems  Constitutional:  Negative for chills and fever.  All other systems reviewed and are negative.   There were no vitals taken for this visit. Physical Exam Vitals reviewed.  HENT:     Head: Normocephalic.  Eyes:     Pupils: Pupils are equal, round, and reactive to light.  Cardiovascular:     Rate and Rhythm: Normal rate.  Pulmonary:     Effort: Pulmonary effort is normal.  Abdominal:     General: Abdomen is flat.  Genitourinary:    Comments: No CVAT at present Musculoskeletal:        General: Normal range of motion.     Cervical back: Normal range of motion.  Skin:    General: Skin is warm.  Neurological:     General: No focal deficit present.     Mental Status:  He is alert.  Psychiatric:        Mood and Affect: Mood normal.      Assessment/Plan  Proceed as planned with left ureteroscopic stone manipulation and cyst marsupialization. Risks, benefits, alternatives, expected peri-op course discussed previously and reiterated today. He understands he will have peri-op stent to promote complete and permannt internal drainage of cyst.   Loletta Parish., MD 07/04/2023, 8:05 AM

## 2023-07-04 NOTE — Anesthesia Postprocedure Evaluation (Signed)
Anesthesia Post Note  Patient: Travis Fleming  Procedure(s) Performed: CYSTOSCOPY/LEFT RETROGRADE PYELOGRAM/URETEROSCOPY/HOLMIUM LASER/STENT PLACEMENT/CYST MARSUPIALIZATION (Left)     Patient location during evaluation: PACU Anesthesia Type: General Level of consciousness: awake Pain management: pain level controlled Vital Signs Assessment: post-procedure vital signs reviewed and stable Respiratory status: spontaneous breathing, nonlabored ventilation and respiratory function stable Cardiovascular status: blood pressure returned to baseline and stable Postop Assessment: no apparent nausea or vomiting Anesthetic complications: no   No notable events documented.  Last Vitals:  Vitals:   07/04/23 1330 07/04/23 1340  BP: (!) 138/91 136/88  Pulse: 70 75  Resp: 14 20  Temp: 36.7 C 36.6 C  SpO2: 100% 99%    Last Pain:  Vitals:   07/04/23 1340  TempSrc:   PainSc: 2                  Baltazar Pekala P [name]

## 2023-07-04 NOTE — Anesthesia Procedure Notes (Signed)
Procedure Name: Intubation Date/Time: 07/04/2023 11:48 AM  Performed by: Carloyn Manner, CRNAPre-anesthesia Checklist: Patient identified, Emergency Drugs available, Suction available, Patient being monitored and Timeout performed Patient Re-evaluated:Patient Re-evaluated prior to induction Oxygen Delivery Method: Circle system utilized Preoxygenation: Pre-oxygenation with 100% oxygen Induction Type: IV induction Ventilation: Mask ventilation with difficulty Laryngoscope Size: Glidescope and 4 Grade View: Grade I Tube type: Oral Tube size: 8.0 mm Number of attempts: 1 Airway Equipment and Method: Stylet and Video-laryngoscopy Placement Confirmation: ETT inserted through vocal cords under direct vision, positive ETCO2 and breath sounds checked- equal and bilateral Secured at: 22 cm Tube secured with: Tape Dental Injury: Teeth and Oropharynx as per pre-operative assessment

## 2023-07-05 ENCOUNTER — Encounter (HOSPITAL_COMMUNITY): Payer: Self-pay | Admitting: Urology

## 2023-09-04 ENCOUNTER — Other Ambulatory Visit (HOSPITAL_COMMUNITY): Payer: Self-pay

## 2023-09-04 MED ORDER — INDOMETHACIN 50 MG PO CAPS
50.0000 mg | ORAL_CAPSULE | Freq: Two times a day (BID) | ORAL | 0 refills | Status: DC
Start: 2023-09-04 — End: 2023-10-30
  Filled 2023-09-04: qty 60, 30d supply, fill #0

## 2023-10-27 ENCOUNTER — Other Ambulatory Visit (HOSPITAL_COMMUNITY): Payer: Self-pay

## 2023-10-28 ENCOUNTER — Other Ambulatory Visit (HOSPITAL_COMMUNITY): Payer: Self-pay

## 2023-10-29 ENCOUNTER — Other Ambulatory Visit (HOSPITAL_COMMUNITY): Payer: Self-pay

## 2023-10-30 ENCOUNTER — Other Ambulatory Visit: Payer: Self-pay

## 2023-10-30 ENCOUNTER — Other Ambulatory Visit (HOSPITAL_COMMUNITY): Payer: Self-pay

## 2023-10-30 MED ORDER — INDOMETHACIN 50 MG PO CAPS
50.0000 mg | ORAL_CAPSULE | Freq: Two times a day (BID) | ORAL | 0 refills | Status: AC
Start: 2023-10-30 — End: ?
  Filled 2023-10-30 – 2023-10-31 (×2): qty 60, 30d supply, fill #0

## 2023-10-31 ENCOUNTER — Other Ambulatory Visit: Payer: Self-pay

## 2023-10-31 ENCOUNTER — Other Ambulatory Visit (HOSPITAL_COMMUNITY): Payer: Self-pay

## 2024-03-01 ENCOUNTER — Emergency Department (HOSPITAL_COMMUNITY)
Admission: EM | Admit: 2024-03-01 | Discharge: 2024-03-01 | Disposition: A | Discharge status: Home / Self Care | Attending: Emergency Medicine | Admitting: Emergency Medicine

## 2024-03-01 ENCOUNTER — Emergency Department (HOSPITAL_COMMUNITY)

## 2024-03-01 ENCOUNTER — Encounter (HOSPITAL_COMMUNITY): Payer: Self-pay

## 2024-03-01 ENCOUNTER — Other Ambulatory Visit: Payer: Self-pay

## 2024-03-01 DIAGNOSIS — R1030 Lower abdominal pain, unspecified: Secondary | ICD-10-CM | POA: Diagnosis present

## 2024-03-01 DIAGNOSIS — N201 Calculus of ureter: Secondary | ICD-10-CM | POA: Diagnosis not present

## 2024-03-01 LAB — CBC
HCT: 49.5 % (ref 39.0–52.0)
Hemoglobin: 15.6 g/dL (ref 13.0–17.0)
MCH: 27.5 pg (ref 26.0–34.0)
MCHC: 31.5 g/dL (ref 30.0–36.0)
MCV: 87.3 fL (ref 80.0–100.0)
Platelets: 191 K/uL (ref 150–400)
RBC: 5.67 MIL/uL (ref 4.22–5.81)
RDW: 13.5 % (ref 11.5–15.5)
WBC: 9.2 K/uL (ref 4.0–10.5)
nRBC: 0 % (ref 0.0–0.2)

## 2024-03-01 LAB — COMPREHENSIVE METABOLIC PANEL WITH GFR
ALT: 27 U/L (ref 0–44)
AST: 21 U/L (ref 15–41)
Albumin: 4.7 g/dL (ref 3.5–5.0)
Alkaline Phosphatase: 61 U/L (ref 38–126)
Anion gap: 15 (ref 5–15)
BUN: 23 mg/dL — ABNORMAL HIGH (ref 6–20)
CO2: 22 mmol/L (ref 22–32)
Calcium: 10.3 mg/dL (ref 8.9–10.3)
Chloride: 105 mmol/L (ref 98–111)
Creatinine, Ser: 1.44 mg/dL — ABNORMAL HIGH (ref 0.61–1.24)
GFR, Estimated: 56 mL/min — ABNORMAL LOW (ref >60)
Glucose, Bld: 171 mg/dL — ABNORMAL HIGH (ref 70–99)
Potassium: 4.1 mmol/L (ref 3.5–5.1)
Sodium: 142 mmol/L (ref 135–145)
Total Bilirubin: 0.5 mg/dL (ref 0.0–1.2)
Total Protein: 7.5 g/dL (ref 6.5–8.1)

## 2024-03-01 LAB — URINALYSIS, ROUTINE W REFLEX MICROSCOPIC
Bacteria, UA: NONE SEEN
Bilirubin Urine: NEGATIVE
Glucose, UA: NEGATIVE mg/dL
Ketones, ur: 5 mg/dL — AB
Nitrite: NEGATIVE
Protein, ur: 30 mg/dL — AB
RBC / HPF: >50 RBC/hpf (ref 0–5)
Specific Gravity, Urine: 1.025 (ref 1.005–1.030)
pH: 5 (ref 5.0–8.0)

## 2024-03-01 LAB — LIPASE, BLOOD: Lipase: 18 U/L (ref 11–51)

## 2024-03-01 MED ORDER — DICYCLOMINE HCL 10 MG PO CAPS
10.0000 mg | ORAL_CAPSULE | Freq: Once | ORAL | Status: DC
  Filled 2024-03-01: qty 1

## 2024-03-01 MED ORDER — ONDANSETRON 4 MG PO TBDP: ORAL_TABLET | ORAL | 0 refills | Status: AC

## 2024-03-01 MED ORDER — KETOROLAC TROMETHAMINE 15 MG/ML IJ SOLN
15.0000 mg | Freq: Once | INTRAMUSCULAR | Status: AC
  Administered 2024-03-01: 15 mg via INTRAVENOUS
  Filled 2024-03-01: qty 1

## 2024-03-01 MED ORDER — ALUM & MAG HYDROXIDE-SIMETH 200-200-20 MG/5ML PO SUSP
30.0000 mL | Freq: Once | ORAL | Status: AC
  Administered 2024-03-01: 30 mL via ORAL
  Filled 2024-03-01: qty 30

## 2024-03-01 MED ORDER — ONDANSETRON HCL 4 MG/2ML IJ SOLN
4.0000 mg | Freq: Once | INTRAMUSCULAR | Status: AC
  Administered 2024-03-01: 4 mg via INTRAVENOUS
  Filled 2024-03-01: qty 2

## 2024-03-01 MED ORDER — TAMSULOSIN HCL 0.4 MG PO CAPS: 0.4000 mg | ORAL_CAPSULE | Freq: Every day | ORAL | 0 refills | Status: AC

## 2024-03-01 MED ORDER — MORPHINE SULFATE 15 MG PO TABS: 7.5000 mg | ORAL_TABLET | ORAL | 0 refills | Status: AC | PRN

## 2024-03-01 MED ORDER — SODIUM CHLORIDE 0.9 % IV BOLUS
1000.0000 mL | Freq: Once | INTRAVENOUS | Status: AC
  Administered 2024-03-01: 1000 mL via INTRAVENOUS

## 2024-03-01 NOTE — ED Triage Notes (Signed)
 Abdominal pain that started at 0300 today after eating. Denies N/V/D.

## 2024-03-01 NOTE — ED Provider Notes (Signed)
 [place] EMERGENCY DEPARTMENT AT [facility] Provider Note   CSN: [id number] Arrival date & time: 03/01/24  9358     Patient presents with: Abdominal Pain   Travis Fleming is a 58 y.o. male.   58 yo M with a chief complaints of abdominal pain.  This is diffuse and lower, going on since about 3:30 in the morning.  Patient works third shift and that is typically when he eats dinner.  He ate an hour or 2 later developed some pain.  He has trouble describing it.  Nothing makes it better or worse.  Has been persistent since.  No nausea vomiting or diarrhea.  No fevers.  He actually had similar symptoms when he ate the same meal earlier.  He said he cooked in the crockpot and it sounds like he has left it in there for some time and then reheated it up in the crockpot.   Abdominal Pain      Prior to Admission medications   Medication Sig Start Date End Date Taking? Authorizing Provider  morphine  (MSIR) 15 MG tablet Take 0.5 tablets (7.5 mg total) by mouth every 4 (four) hours as needed for severe pain (pain score 7-10). 03/01/24  Yes Emil Share, DO  ondansetron  (ZOFRAN -ODT) 4 MG disintegrating tablet 4mg  ODT q4 hours prn nausea/vomit 03/01/24  Yes Tayja Manzer, DO  tamsulosin  (FLOMAX ) 0.4 MG CAPS capsule Take 1 capsule (0.4 mg total) by mouth daily after supper. 03/01/24  Yes Emil Share, DO  Berberine Chloride (BERBERINE HCI PO) Take 1 capsule by mouth daily.    [provider]  Cholecalciferol (VITAMIN D3) 5000 UNITS TABS Take 5,000-20,000 Units by mouth daily. Dose varies depending on patient exposure to sunlight    [provider]  Continuous Blood Gluc Sensor (FREESTYLE LIBRE SENSOR SYSTEM) MISC Use as directed. 02/04/20   Hilts, Ozell, MD  indomethacin  (INDOCIN ) 50 MG capsule Take 1 capsule (50 mg total) by mouth 2 (two) times daily with food or milk. 10/30/23     ketorolac  (TORADOL ) 10 MG tablet Take 1 tablet (10 mg total) by mouth every 8 (eight) hours as  needed for moderate pain (pain score 4-6) (or stent discomfort post-operatively). 07/04/23   Alvaro Ricardo KATHEE Mickey., MD  KRILL OIL PO Take 1 capsule by mouth daily.    [provider]  Menatetrenone (VITAMIN K2) 100 MCG TABS Take 200 mcg by mouth daily.    [provider]  Multiple Vitamin (MULTIVITAMIN WITH MINERALS) TABS tablet Take 1 tablet by mouth daily.     [provider]  NON FORMULARY PT uses a c-pap nightly    [provider]  oxyCODONE  (ROXICODONE ) 5 MG immediate release tablet Take 1 tablet (5 mg total) by mouth every 6 (six) hours as needed for breakthrough pain (post-operatively). 07/04/23 07/03/24  Alvaro Ricardo KATHEE Mickey., MD  QUERCETIN PO Take 1 tablet by mouth daily.    [provider]  senna-docusate (SENOKOT-S) 8.6-50 MG tablet Take 1 tablet by mouth 2 (two) times daily. While taking strongest pain meds to prevent constipation. 07/04/23   Alvaro Ricardo KATHEE Mickey., MD  sulfamethoxazole -trimethoprim  (BACTRIM  DS) 800-160 MG tablet Take 1 tablet by mouth 2 (two) times daily for 3 days to prevent infection with tethered stent. 07/04/23   Manny, Ricardo KATHEE Mickey., MD    Allergies: Tape and Penicillins    Review of Systems  Gastrointestinal:  Positive for abdominal pain.    Updated Vital Signs BP (!) 156/87  Pulse 76   Temp 97.6 F (36.4 C) (Oral)   Resp 18   Ht 6' 2 (1.88 m)   Wt 127 kg   SpO2 98%   BMI 35.95 kg/m   Physical Exam Vitals and nursing note reviewed.  Constitutional:      Appearance: He is well-developed.  HENT:     Head: Normocephalic and atraumatic.  Eyes:     Pupils: Pupils are equal, round, and reactive to light.  Neck:     Vascular: No JVD.  Cardiovascular:     Rate and Rhythm: Normal rate and regular rhythm.     Heart sounds: No murmur heard.    No friction rub. No gallop.  Pulmonary:     Effort: No respiratory distress.     Breath sounds: No wheezing.  Abdominal:     General: There is no distension.      Tenderness: There is no abdominal tenderness. There is no guarding or rebound.     Comments: Benign abdominal exam  Musculoskeletal:        General: Normal range of motion.     Cervical back: Normal range of motion and neck supple.  Skin:    Coloration: Skin is not pale.     Findings: No rash.  Neurological:     Mental Status: He is alert and oriented to person, place, and time.  Psychiatric:        Behavior: Behavior normal.     (all labs ordered are listed, but only abnormal results are displayed) Labs Reviewed  COMPREHENSIVE METABOLIC PANEL WITH GFR - Abnormal; Notable for the following components:      Result Value   Glucose, Bld 171 (*)    BUN 23 (*)    Creatinine, Ser 1.44 (*)    GFR, Estimated 56 (*)    All other components within normal limits  URINALYSIS, ROUTINE W REFLEX MICROSCOPIC - Abnormal; Notable for the following components:   APPearance HAZY (*)    Hgb urine dipstick LARGE (*)    Ketones, ur 5 (*)    Protein, ur 30 (*)    Leukocytes,Ua TRACE (*)    All other components within normal limits  LIPASE, BLOOD  CBC    EKG: None  Radiology: CT Renal Stone Study Result Date: 03/01/2024 CLINICAL DATA:  Abdominal pain after eating. EXAM: CT ABDOMEN AND PELVIS WITHOUT CONTRAST TECHNIQUE: Multidetector CT imaging of the abdomen and pelvis was performed following the standard protocol without IV contrast. RADIATION DOSE REDUCTION: This exam was performed according to the departmental dose-optimization program which includes automated exposure control, adjustment of the mA and/or kV according to patient size and/or use of iterative reconstruction technique. COMPARISON:  02/07/2013 FINDINGS: Lower chest: Unremarkable. Hepatobiliary: No suspicious focal abnormality in the liver on this study without intravenous contrast. There is no evidence for gallstones, gallbladder wall thickening, or pericholecystic fluid. No intrahepatic or extrahepatic biliary dilation. Pancreas: No  focal mass lesion. No dilatation of the main duct. No intraparenchymal cyst. No peripancreatic edema. Spleen: No splenomegaly. No suspicious focal mass lesion. Adrenals/Urinary Tract: No adrenal nodule or mass. Stable low-density lesion upper pole left kidney is compatible with a cyst. 1.5 x 1.4 cm nonobstructing stone is identified lower pole left kidney. Mild fullness noted right intrarenal collecting system and ureter with 4 mm proximal right ureteral stone seen on image 52/2 and coronal 94/6. The urinary bladder appears normal for the degree of distention. Stomach/Bowel: Stomach is unremarkable. No gastric wall thickening. No evidence of  outlet obstruction. Duodenum is normally positioned as is the ligament of Treitz. No small bowel wall thickening. No small bowel dilatation. The terminal ileum is normal. The appendix is normal. No gross colonic mass. No colonic wall thickening. Vascular/Lymphatic: Insert no abdominal aortic aneurysm. There is no gastrohepatic or hepatoduodenal ligament lymphadenopathy. No retroperitoneal or mesenteric lymphadenopathy. No pelvic sidewall lymphadenopathy. Reproductive: The prostate gland and seminal vesicles are unremarkable. Other: No intraperitoneal free fluid. Musculoskeletal: No worrisome lytic or sclerotic osseous abnormality. Tiny umbilical hernia contains only fat. IMPRESSION: 1. 4 mm proximal right ureteral stone with mild fullness of the right intrarenal collecting system and ureter. Associated right perinephric edema evident. 2. 1.5 x 1.4 cm nonobstructing stone lower pole left kidney. Electronically Signed   By: Camellia Candle M.D.   On: 03/01/2024 09:20     Procedures   Medications Ordered in the ED  dicyclomine  (BENTYL ) capsule 10 mg (10 mg Oral Patient Refused/Not Given 03/01/24 0742)  sodium chloride  0.9 % bolus 1,000 mL (0 mLs Intravenous Stopped 03/01/24 0954)  ondansetron  (ZOFRAN ) injection 4 mg (4 mg Intravenous Given 03/01/24 0742)  alum & mag  hydroxide-simeth (MAALOX/MYLANTA) 200-200-20 MG/5ML suspension 30 mL (30 mLs Oral Given 03/01/24 0735)  ketorolac  (TORADOL ) 15 MG/ML injection 15 mg (15 mg Intravenous Given 03/01/24 0933)                                    Medical Decision Making Amount and/or Complexity of Data Reviewed Labs: ordered. Radiology: ordered.  Risk OTC drugs. Prescription drug management.   58 yo M with a chief complaints of lower abdominal discomfort after eating meat that been left out for some time.  He says he made this in the crock pot.  Had a meal with it and had pain, resolved and then had leftovers this evening and had recurrent symptoms.   No nausea vomiting diarrhea or fever.  Benign abdominal exam.  Will obtain blood work here.  Treat symptoms.  Reassess.   With hematuria, no signs of infection.  AKI.  Discussed results with patient.  Will obtain CT imaging to assess for bilateral renal stones.  CT right-sided renal stone.  Pain well-controlled.  Will discharge home.  PCP follow-up  10:52 AM:  I have discussed the diagnosis/risks/treatment options with the patient.  Evaluation and diagnostic testing in the emergency department does not suggest an emergent condition requiring admission or immediate intervention beyond what has been performed at this time.  They will follow up with Urology, PCP. We also discussed returning to the ED immediately if new or worsening sx occur. We discussed the sx which are most concerning (e.g., sudden worsening pain, fever, inability to tolerate by mouth) that necessitate immediate return. Medications administered to the patient during their visit and any new prescriptions provided to the patient are listed below.  Medications given during this visit Medications  dicyclomine  (BENTYL ) capsule 10 mg (10 mg Oral Patient Refused/Not Given 03/01/24 0742)  sodium chloride  0.9 % bolus 1,000 mL (0 mLs Intravenous Stopped 03/01/24 0954)  ondansetron  (ZOFRAN ) injection 4 mg (4  mg Intravenous Given 03/01/24 0742)  alum & mag hydroxide-simeth (MAALOX/MYLANTA) 200-200-20 MG/5ML suspension 30 mL (30 mLs Oral Given 03/01/24 0735)  ketorolac  (TORADOL ) 15 MG/ML injection 15 mg (15 mg Intravenous Given 03/01/24 0933)     The patient appears reasonably screen and/or stabilized for discharge and I doubt any other medical condition or other [facility]  requiring further screening, evaluation, or treatment in the ED at this time prior to discharge.       Final diagnoses:  Ureterolithiasis    ED Discharge Orders          Ordered    morphine  (MSIR) 15 MG tablet  Every 4 hours PRN        03/01/24 0929    tamsulosin  (FLOMAX ) 0.4 MG CAPS capsule  Daily after supper        03/01/24 0929    ondansetron  (ZOFRAN -ODT) 4 MG disintegrating tablet        03/01/24 0929               Emil Share, DO 03/01/24 1052

## 2024-03-01 NOTE — Discharge Instructions (Addendum)
 Follow up with the urologist in the office. Return for worsening pain, fever.  Take 4 over the counter ibuprofen tablets 3 times a day or 2 over-the-counter naproxen tablets twice a day for pain. Also take tylenol  1000mg (2 extra strength) four times a day.   Then take the pain medicine if you feel like you need it. Narcotics do not help with the pain, they only make you care about it less.  You can become addicted to this, people may break into your house to steal it.  It will constipate you.  If you drive under the influence of this medicine you can get a DUI.

## 2024-03-18 ENCOUNTER — Encounter: Payer: Self-pay | Admitting: Family Medicine

## 2024-05-26 ENCOUNTER — Emergency Department (HOSPITAL_COMMUNITY)
Admission: EM | Admit: 2024-05-26 | Discharge: 2024-05-26 | Disposition: A | Discharge status: Home / Self Care | Attending: Emergency Medicine | Admitting: Emergency Medicine

## 2024-05-26 ENCOUNTER — Encounter (HOSPITAL_COMMUNITY): Payer: Self-pay

## 2024-05-26 ENCOUNTER — Other Ambulatory Visit: Payer: Self-pay

## 2024-05-26 ENCOUNTER — Other Ambulatory Visit (HOSPITAL_COMMUNITY): Payer: Self-pay

## 2024-05-26 DIAGNOSIS — M5416 Radiculopathy, lumbar region: Secondary | ICD-10-CM | POA: Insufficient documentation

## 2024-05-26 DIAGNOSIS — I10 Essential (primary) hypertension: Secondary | ICD-10-CM | POA: Insufficient documentation

## 2024-05-26 DIAGNOSIS — M545 Low back pain, unspecified: Secondary | ICD-10-CM | POA: Diagnosis present

## 2024-05-26 LAB — URINALYSIS, ROUTINE W REFLEX MICROSCOPIC
Bacteria, UA: NONE SEEN
Bilirubin Urine: NEGATIVE
Glucose, UA: NEGATIVE mg/dL
Ketones, ur: NEGATIVE mg/dL
Leukocytes,Ua: NEGATIVE
Nitrite: NEGATIVE
Protein, ur: 30 mg/dL — AB
Specific Gravity, Urine: 1.019 (ref 1.005–1.030)
pH: 5 (ref 5.0–8.0)

## 2024-05-26 MED ORDER — METHOCARBAMOL 500 MG PO TABS
500.0000 mg | ORAL_TABLET | Freq: Two times a day (BID) | ORAL | 0 refills | Status: AC
  Filled 2024-05-26 (×2): qty 20, 10d supply, fill #0

## 2024-05-26 MED ORDER — METHOCARBAMOL 500 MG PO TABS
500.0000 mg | ORAL_TABLET | Freq: Once | ORAL | Status: AC
  Administered 2024-05-26: 500 mg via ORAL
  Filled 2024-05-26: qty 1

## 2024-05-26 MED ORDER — MELOXICAM 7.5 MG PO TABS
7.5000 mg | ORAL_TABLET | Freq: Every day | ORAL | 0 refills | Status: AC
  Filled 2024-05-26 (×2): qty 30, 30d supply, fill #0

## 2024-05-26 MED ORDER — LIDOCAINE 5 % EX PTCH
1.0000 | MEDICATED_PATCH | CUTANEOUS | Status: DC
  Administered 2024-05-26: 1 via TRANSDERMAL
  Filled 2024-05-26: qty 1

## 2024-05-26 MED ORDER — PREDNISONE 10 MG PO TABS
ORAL_TABLET | Freq: Every day | ORAL | 0 refills | Status: AC
  Filled 2024-05-26: qty 42, fill #0
  Filled 2024-05-26: qty 42, 12d supply, fill #0

## 2024-05-26 MED ORDER — KETOROLAC TROMETHAMINE 15 MG/ML IJ SOLN
15.0000 mg | Freq: Once | INTRAMUSCULAR | Status: AC
  Administered 2024-05-26: 15 mg via INTRAMUSCULAR
  Filled 2024-05-26: qty 1

## 2024-05-26 MED ORDER — LIDOCAINE 5 % EX PTCH
1.0000 | MEDICATED_PATCH | CUTANEOUS | 0 refills | Status: AC
  Filled 2024-05-26 (×2): qty 30, 30d supply, fill #0

## 2024-05-26 MED ORDER — OXYCODONE-ACETAMINOPHEN 5-325 MG PO TABS
1.0000 | ORAL_TABLET | Freq: Three times a day (TID) | ORAL | 0 refills | Status: AC | PRN
  Filled 2024-05-26 (×2): qty 9, 3d supply, fill #0

## 2024-05-26 NOTE — ED Provider Notes (Signed)
 [place] EMERGENCY DEPARTMENT AT [facility] Provider Note   CSN: [id number] Arrival date & time: 05/26/24  1358     Patient presents with: Back Pain   Travis Fleming is a 58 y.o. male with past medical history of anemia, OSA, kidney stones presents to Emergency Department for evaluation of right lower back pain that started 3 days ago.  Pain radiates into posterior aspect of thigh and into right calf.  Pain is described as a burning.  He has had radicular symptoms in his back in March.  Typically the symptoms improved with indomethacin .  Reports that this feels similar however the indomethacin  has not improved his pain.  No traumatic injury.  Does have a history of kidney stones but reports this does not feel similar and pain is not intermittent/waxing and waning but constant.  Denies fevers, IVDU, malignancy, saddle paresthesia, urinary incontinence, urinary symptoms.    Back Pain      Prior to Admission medications   Medication Sig Start Date End Date Taking? Authorizing Provider  Berberine Chloride (BERBERINE HCI PO) Take 1 capsule by mouth daily.    [provider]  Cholecalciferol (VITAMIN D3) 5000 UNITS TABS Take 5,000-20,000 Units by mouth daily. Dose varies depending on patient exposure to sunlight    [provider]  Continuous Blood Gluc Sensor (FREESTYLE LIBRE SENSOR SYSTEM) MISC Use as directed. 02/04/20   Hilts, Ozell, MD  indomethacin  (INDOCIN ) 50 MG capsule Take 1 capsule (50 mg total) by mouth 2 (two) times daily with food or milk. 10/30/23     ketorolac  (TORADOL ) 10 MG tablet Take 1 tablet (10 mg total) by mouth every 8 (eight) hours as needed for moderate pain (pain score 4-6) (or stent discomfort post-operatively). 07/04/23   Travis Ricardo KATHEE Mickey., MD  KRILL OIL PO Take 1 capsule by mouth daily.    [provider]  Menatetrenone (VITAMIN K2) 100 MCG TABS Take 200 mcg by mouth daily.    [provider]  morphine  (MSIR)  15 MG tablet Take 0.5 tablets (7.5 mg total) by mouth every 4 (four) hours as needed for severe pain (pain score 7-10). 03/01/24   Floyd, Dan, DO  Multiple Vitamin (MULTIVITAMIN WITH MINERALS) TABS tablet Take 1 tablet by mouth daily.     [provider]  NON FORMULARY PT uses a c-pap nightly    [provider]  ondansetron  (ZOFRAN -ODT) 4 MG disintegrating tablet 4mg  ODT q4 hours prn nausea/vomit 03/01/24   Floyd, Dan, DO  oxyCODONE  (ROXICODONE ) 5 MG immediate release tablet Take 1 tablet (5 mg total) by mouth every 6 (six) hours as needed for breakthrough pain (post-operatively). 07/04/23 07/03/24  Travis Ricardo KATHEE Mickey., MD  QUERCETIN PO Take 1 tablet by mouth daily.    [provider]  senna-docusate (SENOKOT-S) 8.6-50 MG tablet Take 1 tablet by mouth 2 (two) times daily. While taking strongest pain meds to prevent constipation. 07/04/23   Travis Ricardo KATHEE Mickey., MD  sulfamethoxazole -trimethoprim  (BACTRIM  DS) 800-160 MG tablet Take 1 tablet by mouth 2 (two) times daily for 3 days to prevent infection with tethered stent. 07/04/23   Travis Ricardo KATHEE Mickey., MD  tamsulosin  (FLOMAX ) 0.4 MG CAPS capsule Take 1 capsule (0.4 mg total) by mouth daily after supper. 03/01/24   Travis Share, DO    Allergies: Tape and Penicillins    Review of Systems  Musculoskeletal:  Positive for back pain.    Updated Vital Signs BP (!) 174/88   Pulse 62  Temp 98 F (36.7 C) (Oral)   Resp 18   Ht 6' 2 (1.88 m)   Wt 122.5 kg   SpO2 100%   BMI 34.67 kg/m   Physical Exam Vitals and nursing note reviewed.  Constitutional:      General: He is not in acute distress.    Appearance: Normal appearance.  HENT:     Head: Normocephalic and atraumatic.  Eyes:     Conjunctiva/sclera: Conjunctivae normal.  Cardiovascular:     Rate and Rhythm: Normal rate.     Pulses:          Dorsalis pedis pulses are 2+ on the right side and 2+ on the left side.  Pulmonary:     Effort: Pulmonary effort is  normal. No respiratory distress.  Musculoskeletal:     Cervical back: No tenderness or bony tenderness.     Thoracic back: No tenderness or bony tenderness.     Lumbar back: Tenderness (right) present. No bony tenderness. Positive right straight leg raise test. Negative left straight leg raise test.     Right lower leg: No edema.     Left lower leg: No edema.     Comments: No swelling, tenderness, erythema, streaking, nor warmth to the bilateral lower extremities.  Varicose veins bilaterally. Homans sign negative x2  Skin:    Coloration: Skin is not jaundiced or pale.  Neurological:     Mental Status: He is alert. Mental status is at baseline.     Comments: Motor 5/5 and sensation 2/2 of BLE. Ambulates with mild limp 2/2 pain     (all labs ordered are listed, but only abnormal results are displayed) Labs Reviewed - No data to display  EKG: None  Radiology: No results found.   Medications Ordered in the ED  methocarbamol  (ROBAXIN ) tablet 500 mg (has no administration in time range)  lidocaine  (LIDODERM ) 5 % 1 patch (has no administration in time range)  ketorolac  (TORADOL ) 15 MG/ML injection 15 mg (has no administration in time range)                                    Medical Decision Making Amount and/or Complexity of Data Reviewed Labs: ordered.  Risk Prescription drug management.   Patient presents to the ED for concern of right back pain, leg pain, this involves an extensive number of treatment options, and is a complaint that carries with it a high risk of complications and morbidity.  The differential diagnosis includes lumbar arthropathy, sciatica, DVT, cauda equina, spinal abscess, neoplasm, impingement, DDD, kidney stone, UTI.  Not an exhaustive list   Co morbidities that complicate the patient evaluation  History of kidney stone, radicular symptoms   Additional history obtained:  Additional history obtained from Nursing   External records from outside  source obtained and reviewed including triage RN note   Lab Tests:  I Ordered, and personally interpreted labs.  The pertinent results include:   UA with small hgb    Medicines ordered and prescription drug management:  I ordered medication including robaxin , lidocaine  patch, prednisone   for lumbar radiculopathy  Reevaluation of the patient after these medicines showed that the patient improved I have reviewed the patients home medicines and have made adjustments as needed     Problem List / ED Course:  Lumbar radiculopathy Physical exam notable for tenderness to palpation of right paraspinous musculature.  Pain radiates into medial  aspect of leg and into the back of calf.  Pain is described as burning.  Does have a history of nerve compression and back in the past.  His last flare was in March 2025 and feels similar.  He was provided indomethacin  with improvement of symptoms. Does have a history of kidney stones with his last stone 03/01/2024.  Pain has been constant over the past 3 days and not coming and going.  He has absolutely no urinary symptoms. UA without infection.  Does note small Hgb but pain does not feel similar to kidney stone. He does not report that this feels similar to kidney stone. Low suspicion for stone, UTI Here in emergency department there is no fever or tachycardia.  He denies fever or chills at home.  No history of IVDU no TTP of lumbar spine.  Low suspicion for spinal abscess No history of malignancy.  There were no masses nor osseous abnormalities advised CT renal stone study 2 months ago.  Low suspicion for neoplasm as etiology. Denies urinary incontinence, saddle paresthesia.  Low suspicion for cauda equina. Had extensive conversation with patient regarding obtaining imaging as symptoms have been occurring for a while, age of 56.  His neurological exam is reassuring.  There are no motor or sensory deficits to legs bilaterally.  He does walk with a limp to  right leg secondary to pain.  He reports that he has been walking this way since March 2025 but worsened over the past 3 days with onset of pain.  Patient reports that he has follow-up appointment with his primary in 2 days and wishes to hold off on imaging at this time but is primarily here for pain Did not have any indomethacin  today.  Will provide muscle relaxer, lidocaine  patch, steroid burst for symptoms.  Patient is going home with Gisele and is not driving.  Did discuss precautions with muscle relaxer, driving, alcohol use, drowsy properties with patient and provided on DC paperwork. Had extensive conversation on return precautions to include symptoms of DVT, cauda equina, worsening weakness or pain, fever without other known etiology  HTN Is hypertensive here at 174/88 which could likely be secondary to pain Did note this on his DC paperwork and patient can follow-up with primary care provider for blood pressure recheck in the setting of no pain or anxiety from ED visit No signs of hypertensive emergency.  No complaints of chest pain, shortness of breath, visual disturbances, nor headache.  Is neurologically intact on my exam.   Reevaluation:  After the interventions noted above, I reevaluated the patient and found that they have :improved    Dispostion:  After consideration of the diagnostic results and the patients response to treatment, I feel that the patent would benefit from outpatient management with primary care.   Discussed ED workup, disposition, return to ED precautions with patient who expresses understanding agrees with plan.  All questions answered to their satisfaction.  They are agreeable to plan.  Discharge instructions provided on paperwork  Final diagnoses:  Right lumbar radiculopathy  Hypertension, unspecified type    ED Discharge Orders     None        Minnie Tinnie BRAVO, PA 05/27/24 0038    Franklyn Sid SAILOR, MD 05/29/24 [id number]

## 2024-05-26 NOTE — ED Notes (Signed)
 Lab said 10 more minutes on the urine.

## 2024-05-26 NOTE — ED Notes (Signed)
 2nd call for lab. They said urine will result in 5 minutes.

## 2024-05-26 NOTE — Discharge Instructions (Signed)
 Thank you for letting us  evaluate you today. Your urine is without signs of infection nor blood. As discussed, follow up with PCP on Friday for outpatient MRI of lumbar spine.  I provided you with meloxicam  which is once a day.  This is in same family as ibuprofen, indomethacin , aspirin, Aleve, Advil so do not take them together.  I also provided you with a couple pills of Percocet.  This has Tylenol  in it so do not take Tylenol  at all.  This is reserved for severe debilitating pain.  I also provide you a muscle relaxer.  This may make you drowsy.  Do not operate heavy machinery or drink alcohol until you know how this affects you.  I would recommend taking this at night as it makes you drowsy.  I would also recommend taking Percocet and Robaxin  several hours apart as they both have drowsy like properties and we do not want you to fall.  Return to emergency room if you experience worsening pain despite pain medicine, loss of urinary continence, loss of sensation in genital region, loss of sensation in your legs, inability to walk, worsening symptoms

## 2024-05-26 NOTE — ED Triage Notes (Addendum)
 Patient has lower right back pain that travels down to his right hip and right ankle. Began in March, then went away and came back 1 week ago.

## 2024-06-23 ENCOUNTER — Other Ambulatory Visit (INDEPENDENT_AMBULATORY_CARE_PROVIDER_SITE_OTHER): Payer: Self-pay

## 2024-06-23 ENCOUNTER — Ambulatory Visit: Admitting: Physical Medicine and Rehabilitation

## 2024-06-23 ENCOUNTER — Encounter: Payer: Self-pay | Admitting: Physical Medicine and Rehabilitation

## 2024-06-23 DIAGNOSIS — G8929 Other chronic pain: Secondary | ICD-10-CM

## 2024-06-23 DIAGNOSIS — M5441 Lumbago with sciatica, right side: Secondary | ICD-10-CM

## 2024-06-23 DIAGNOSIS — R1031 Right lower quadrant pain: Secondary | ICD-10-CM | POA: Diagnosis not present

## 2024-06-23 DIAGNOSIS — M5416 Radiculopathy, lumbar region: Secondary | ICD-10-CM

## 2024-06-23 DIAGNOSIS — R202 Paresthesia of skin: Secondary | ICD-10-CM | POA: Diagnosis not present

## 2024-06-23 NOTE — Progress Notes (Signed)
 Pain Scale   Average Pain 1 Patient advising he has chronic lower back to right hip and leg, pain increases when sitting, bending and standing.         +Driver, -BT, -Dye Allergies.

## 2024-06-23 NOTE — Progress Notes (Signed)
 "  Travis Fleming - 59 y.o. male MRN [id number]  Date of birth: [birth year]  Office Visit Note: Visit Date: 06/23/2024 PCP: Auston Opal, DO Referred by: Auston Opal, DO  Subjective: Chief Complaint  Patient presents with   Lower Back - Pain   HPI: Travis Fleming is a 59 y.o. male who comes in today per the request of Dr. Opal Auston for evaluation of chronic, worsening and severe right sided lower back pain radiating to lateral hip, groin and down anterior and medial aspect of leg to ankle. Also reports cramping to right quadricept muscle and numbness to anterior shin and medial aspect of ankle. He was referred to us  by his PCP for evaluation of possible meralgia paraesthetica. He feels right leg is weak and difficult to control. Pain ongoing for over a year, worsens with standing and activity. He describes pain as aching and throbbing sensation, currently rates a 8 out of 10. He was evaluated in the emergency room on 05/26/2024. He was diagnosed with lumbar radiculopathy and prescribed oral Prednisone , Mobic , Robaxin , Percocet and Lidocaine  patches. Some relief of pain with home exercise regimen, rest and use of medications. No prior imaging of lumbar spine. No history of lumbar surgery/injections. Patient denies recent trauma or falls. No bowel/bladder incontinence.      Review of Systems  Musculoskeletal:  Positive for back pain.  Neurological:  Positive for tingling and weakness. Negative for sensory change.  All other systems reviewed and are negative.  Otherwise per HPI.  Assessment & Plan: Visit Diagnoses:    ICD-10-CM   1. Chronic right-sided low back pain with right-sided sciatica  M54.41 MR LUMBAR SPINE WO CONTRAST   G89.29     2. Lumbar radiculopathy  M54.16 XR Lumbar Spine 2-3 Views    MR LUMBAR SPINE WO CONTRAST    3. Groin pain, chronic, right  R10.31 MR LUMBAR SPINE WO CONTRAST   G89.29     4. Paresthesia of skin  R20.2 MR LUMBAR SPINE WO CONTRAST       Plan:  Findings:  Chronic, worsening and severe right sided lower back pain radiating to lateral hip, groin and down anterior and medial aspect of leg to ankle. Also reports cramping to right quadricept muscle and paresthesias to anterior shin and medial aspect of ankle. Patient continues to have severe pain despite good conservative therapies such as home exercise regimen, rest and use of medications. I obtained lumbar radiographs in the office today that shows multi level degenerative changes, biforaminal narrowing and facet arthropathy at L5-S1. No spondylolisthesis. There are degenerative changes to bilateral hips, worse on the left. I do think his pain fits with lumbar radiculopathy, more of L4 nerve pattern. There is really no clinical history or exam findings consistent with lateral femoral cutaneous nerve entrapment or neuropathy (Meralgia paresthetica) I think his pain is less likely a hip issue, no pain with internal/external rotation of right hip today. We discussed treatment plan in detail today. Next step is to place order for lumbar MRI imaging. Depending on results of MRI imaging we discussed possibility of performing lumbar epidural steroid injection. His exam today is non focal, good strength noted to bilateral lower extremities.     Meds & Orders: No orders of the defined types were placed in this encounter.   Orders Placed This Encounter  Procedures   XR Lumbar Spine 2-3 Views   MR LUMBAR SPINE WO CONTRAST    Follow-up: Return for Lumbar MRI review.   Procedures:  No procedures performed      Clinical History: No specialty comments available.   He reports that he has never smoked. He has never used smokeless tobacco. No results for input(s): HGBA1C, LABURIC in the last 8760 hours.  Objective:  VS:  HT:    WT:   BMI:     BP:   HR: bpm  TEMP: ( )  RESP:  Physical Exam Vitals and nursing note reviewed.  HENT:     Head: Normocephalic and atraumatic.     Right Ear: External  ear normal.     Left Ear: External ear normal.     Nose: Nose normal.     Mouth/Throat:     Mouth: Mucous membranes are moist.  Eyes:     Extraocular Movements: Extraocular movements intact.  Cardiovascular:     Rate and Rhythm: Normal rate.     Pulses: Normal pulses.  Pulmonary:     Effort: Pulmonary effort is normal.  Abdominal:     General: Abdomen is flat. There is no distension.  Musculoskeletal:        General: Tenderness present.     Cervical back: Normal range of motion.     Comments: Patient rises from seated position to standing without difficulty. Good lumbar range of motion. No pain noted with facet loading. 5/5 strength noted with bilateral hip flexion, knee flexion/extension, ankle dorsiflexion/plantarflexion and EHL. No clonus noted bilaterally. No pain upon palpation of greater trochanters. No pain with internal/external rotation of bilateral hips. Sensation intact bilaterally. Dysesthesias noted to right L4 dermatome. Negative slump test bilaterally. Ambulates without aid, gait steady.     Skin:    General: Skin is warm and dry.     Capillary Refill: Capillary refill takes less than 2 seconds.  Neurological:     General: No focal deficit present.     Mental Status: He is alert and oriented to person, place, and time.  Psychiatric:        Mood and Affect: Mood normal.        Behavior: Behavior normal.     Ortho Exam  Imaging: No results found.   Past Medical/Family/Surgical/Social History: Medications & Allergies reviewed per EMR, new medications updated. Patient Active Problem List   Diagnosis Date Noted   Diplopia 12/24/2017   OSA on CPAP 12/24/2017   Past Medical History:  Diagnosis Date   Allergy    seasonal   Anemia    as a child   History of double vision    History of kidney stones    Sleep apnea    on c-pap   Family History  Problem Relation Age of Onset   Kidney disease Mother    Cancer Sister    Colon cancer Neg Hx    Esophageal  cancer Neg Hx    Rectal cancer Neg Hx    Stomach cancer Neg Hx    Colon polyps Neg Hx    Past Surgical History:  Procedure Laterality Date   COLONOSCOPY     CYSTOSCOPY/URETEROSCOPY/HOLMIUM LASER/STENT PLACEMENT Left 07/04/2023   Procedure: CYSTOSCOPY/LEFT RETROGRADE PYELOGRAM/URETEROSCOPY/HOLMIUM LASER/STENT PLACEMENT/CYST MARSUPIALIZATION;  Surgeon: Alvaro Ricardo KATHEE Mickey., MD;  Location: [facility];  Service: Urology;  Laterality: Left;  90 MINUTES NEEDED FOR CASE   EYE SURGERY Bilateral    lasic   Social History   Occupational History   Not on file  Tobacco Use   Smoking status: Never   Smokeless tobacco: Never  Vaping Use   Vaping status: Never Used  Substance and Sexual Activity   Alcohol use: Yes    Comment: rarely   Drug use: No   Sexual activity: Not on file   "

## 2024-07-08 ENCOUNTER — Other Ambulatory Visit

## 2024-07-09 ENCOUNTER — Other Ambulatory Visit

## 2024-07-22 ENCOUNTER — Ambulatory Visit
Admission: RE | Admit: 2024-07-22 | Discharge: 2024-07-22 | Disposition: A | Source: Ambulatory Visit | Attending: Physical Medicine and Rehabilitation | Admitting: Physical Medicine and Rehabilitation

## 2024-07-22 DIAGNOSIS — R202 Paresthesia of skin: Secondary | ICD-10-CM

## 2024-07-22 DIAGNOSIS — M5416 Radiculopathy, lumbar region: Secondary | ICD-10-CM

## 2024-07-22 DIAGNOSIS — G8929 Other chronic pain: Secondary | ICD-10-CM

## 2024-07-23 ENCOUNTER — Ambulatory Visit: Payer: Self-pay

## 2024-07-23 NOTE — Telephone Encounter (Signed)
-----   Message from Duwaine Pouch, NP sent at 07/23/2024  8:01 AM EST ----- OV for lumbar MRI review.

## 2024-07-23 NOTE — Telephone Encounter (Signed)
 LMX1 to call and schedule MRI review

## 2024-08-03 ENCOUNTER — Ambulatory Visit: Admitting: Physical Medicine and Rehabilitation
